# Patient Record
Sex: Male | Born: 1955 | Race: White | Hispanic: No | Marital: Married | Smoking: Current every day smoker
Health system: Southern US, Community
[De-identification: ages and names within clinical notes are randomized; demographics above are authoritative.]

## PROBLEM LIST (undated history)

## (undated) DIAGNOSIS — M109 Gout, unspecified: Secondary | ICD-10-CM

## (undated) DIAGNOSIS — E119 Type 2 diabetes mellitus without complications: Secondary | ICD-10-CM

## (undated) DIAGNOSIS — I739 Peripheral vascular disease, unspecified: Secondary | ICD-10-CM

## (undated) HISTORY — PX: BONE CYST EXCISION: SHX376

---

## 1998-07-27 ENCOUNTER — Emergency Department (HOSPITAL_COMMUNITY): Admission: EM | Admit: 1998-07-27 | Discharge: 1998-07-28 | Payer: Self-pay | Admitting: Emergency Medicine

## 2000-02-08 ENCOUNTER — Encounter: Payer: Self-pay | Admitting: Family Medicine

## 2000-02-08 ENCOUNTER — Encounter: Admission: RE | Admit: 2000-02-08 | Discharge: 2000-02-08 | Payer: Self-pay | Admitting: Family Medicine

## 2001-12-02 ENCOUNTER — Encounter: Payer: Self-pay | Admitting: Orthopedic Surgery

## 2001-12-02 ENCOUNTER — Emergency Department (HOSPITAL_COMMUNITY): Admission: EM | Admit: 2001-12-02 | Discharge: 2001-12-02 | Payer: Self-pay | Admitting: *Deleted

## 2002-02-05 ENCOUNTER — Encounter: Payer: Self-pay | Admitting: Orthopedic Surgery

## 2002-02-05 ENCOUNTER — Ambulatory Visit (HOSPITAL_COMMUNITY): Admission: RE | Admit: 2002-02-05 | Discharge: 2002-02-05 | Payer: Self-pay | Admitting: Orthopedic Surgery

## 2007-08-11 ENCOUNTER — Encounter: Admission: RE | Admit: 2007-08-11 | Discharge: 2007-08-11 | Payer: Self-pay | Admitting: Family Medicine

## 2014-10-11 ENCOUNTER — Inpatient Hospital Stay (HOSPITAL_COMMUNITY)
Admission: EM | Admit: 2014-10-11 | Discharge: 2014-10-12 | Discharge status: Against Medical Advice | Payer: No Typology Code available for payment source | Source: Home / Self Care | Attending: Vascular Surgery | Admitting: Vascular Surgery

## 2014-10-11 ENCOUNTER — Inpatient Hospital Stay (HOSPITAL_COMMUNITY): Payer: No Typology Code available for payment source

## 2014-10-11 ENCOUNTER — Encounter (HOSPITAL_COMMUNITY): Payer: Self-pay | Admitting: Emergency Medicine

## 2014-10-11 ENCOUNTER — Emergency Department (HOSPITAL_COMMUNITY)
Admission: EM | Admit: 2014-10-11 | Discharge: 2014-10-11 | DRG: 301 | Disposition: A | Discharge status: Home / Self Care | Payer: No Typology Code available for payment source | Attending: Vascular Surgery | Admitting: Vascular Surgery

## 2014-10-11 ENCOUNTER — Encounter (HOSPITAL_COMMUNITY): Payer: Self-pay | Admitting: Family Medicine

## 2014-10-11 ENCOUNTER — Emergency Department (HOSPITAL_COMMUNITY): Payer: No Typology Code available for payment source

## 2014-10-11 DIAGNOSIS — I739 Peripheral vascular disease, unspecified: Secondary | ICD-10-CM | POA: Diagnosis present

## 2014-10-11 DIAGNOSIS — M109 Gout, unspecified: Secondary | ICD-10-CM

## 2014-10-11 DIAGNOSIS — I96 Gangrene, not elsewhere classified: Secondary | ICD-10-CM

## 2014-10-11 DIAGNOSIS — R208 Other disturbances of skin sensation: Secondary | ICD-10-CM

## 2014-10-11 DIAGNOSIS — F17210 Nicotine dependence, cigarettes, uncomplicated: Secondary | ICD-10-CM | POA: Diagnosis present

## 2014-10-11 DIAGNOSIS — M79673 Pain in unspecified foot: Secondary | ICD-10-CM | POA: Diagnosis present

## 2014-10-11 DIAGNOSIS — I7090 Unspecified atherosclerosis: Secondary | ICD-10-CM

## 2014-10-11 DIAGNOSIS — E119 Type 2 diabetes mellitus without complications: Secondary | ICD-10-CM | POA: Diagnosis not present

## 2014-10-11 DIAGNOSIS — Z01810 Encounter for preprocedural cardiovascular examination: Secondary | ICD-10-CM

## 2014-10-11 DIAGNOSIS — E1152 Type 2 diabetes mellitus with diabetic peripheral angiopathy with gangrene: Secondary | ICD-10-CM

## 2014-10-11 DIAGNOSIS — I743 Embolism and thrombosis of arteries of the lower extremities: Secondary | ICD-10-CM

## 2014-10-11 DIAGNOSIS — I70262 Atherosclerosis of native arteries of extremities with gangrene, left leg: Secondary | ICD-10-CM

## 2014-10-11 DIAGNOSIS — M79609 Pain in unspecified limb: Secondary | ICD-10-CM

## 2014-10-11 HISTORY — DX: Gout, unspecified: M10.9

## 2014-10-11 LAB — CBC WITH DIFFERENTIAL/PLATELET
BASOS ABS: 0.1 10*3/uL (ref 0.0–0.1)
Basophils Relative: 1 % (ref 0–1)
Eosinophils Absolute: 0.2 10*3/uL (ref 0.0–0.7)
Eosinophils Relative: 1 % (ref 0–5)
HEMATOCRIT: 43 % (ref 39.0–52.0)
HEMOGLOBIN: 14.7 g/dL (ref 13.0–17.0)
LYMPHS PCT: 15 % (ref 12–46)
Lymphs Abs: 2.6 10*3/uL (ref 0.7–4.0)
MCH: 30.4 pg (ref 26.0–34.0)
MCHC: 34.2 g/dL (ref 30.0–36.0)
MCV: 88.8 fL (ref 78.0–100.0)
MONO ABS: 1.4 10*3/uL — AB (ref 0.1–1.0)
Monocytes Relative: 8 % (ref 3–12)
NEUTROS ABS: 13 10*3/uL — AB (ref 1.7–7.7)
Neutrophils Relative %: 75 % (ref 43–77)
Platelets: 374 10*3/uL (ref 150–400)
RBC: 4.84 MIL/uL (ref 4.22–5.81)
RDW: 13 % (ref 11.5–15.5)
WBC: 17.3 10*3/uL — ABNORMAL HIGH (ref 4.0–10.5)

## 2014-10-11 LAB — BASIC METABOLIC PANEL
ANION GAP: 13 (ref 5–15)
BUN: 5 mg/dL — ABNORMAL LOW (ref 6–23)
CO2: 27 meq/L (ref 19–32)
CREATININE: 0.6 mg/dL (ref 0.50–1.35)
Calcium: 9.8 mg/dL (ref 8.4–10.5)
Chloride: 99 mEq/L (ref 96–112)
GFR calc Af Amer: >90 mL/min (ref >90)
GFR calc non Af Amer: >90 mL/min (ref >90)
Glucose, Bld: 194 mg/dL — ABNORMAL HIGH (ref 70–99)
Potassium: 4.7 mEq/L (ref 3.7–5.3)
Sodium: 139 mEq/L (ref 137–147)

## 2014-10-11 LAB — HEMOGLOBIN A1C
Hgb A1c MFr Bld: 8.1 % — ABNORMAL HIGH (ref <5.7)
Mean Plasma Glucose: 186 mg/dL — ABNORMAL HIGH (ref <117)

## 2014-10-11 LAB — SEDIMENTATION RATE: SED RATE: 33 mm/h — AB (ref 0–16)

## 2014-10-11 LAB — C-REACTIVE PROTEIN: CRP: 8.8 mg/dL — ABNORMAL HIGH (ref <0.60)

## 2014-10-11 MED ORDER — PANTOPRAZOLE SODIUM 40 MG PO TBEC: 40.0000 mg | DELAYED_RELEASE_TABLET | Freq: Every day | ORAL | Status: DC

## 2014-10-11 MED ORDER — ACETAMINOPHEN 325 MG RE SUPP
325.0000 mg | RECTAL | Status: DC | PRN
  Filled 2014-10-11: qty 2

## 2014-10-11 MED ORDER — ONDANSETRON HCL 4 MG/2ML IJ SOLN
4.0000 mg | Freq: Three times a day (TID) | INTRAMUSCULAR | Status: DC | PRN
Start: 2014-10-11 — End: 2014-10-11

## 2014-10-11 MED ORDER — HYDRALAZINE HCL 20 MG/ML IJ SOLN: 5.0000 mg | INTRAMUSCULAR | Status: DC | PRN

## 2014-10-11 MED ORDER — MORPHINE SULFATE 2 MG/ML IJ SOLN: 2.0000 mg | INTRAMUSCULAR | Status: DC | PRN

## 2014-10-11 MED ORDER — IOHEXOL 350 MG/ML SOLN
100.0000 mL | Freq: Once | INTRAVENOUS | Status: AC | PRN
  Administered 2014-10-11: 100 mL via INTRAVENOUS

## 2014-10-11 MED ORDER — PIPERACILLIN-TAZOBACTAM 3.375 G IVPB
3.3750 g | Freq: Three times a day (TID) | INTRAVENOUS | Status: DC
  Administered 2014-10-11 – 2014-10-12 (×3): 3.375 g via INTRAVENOUS
  Filled 2014-10-11 (×5): qty 50

## 2014-10-11 MED ORDER — ONDANSETRON HCL 4 MG/2ML IJ SOLN: 4.0000 mg | Freq: Four times a day (QID) | INTRAMUSCULAR | Status: DC | PRN

## 2014-10-11 MED ORDER — INSULIN ASPART 100 UNIT/ML ~~LOC~~ SOLN: 0.0000 [IU] | SUBCUTANEOUS | Status: DC

## 2014-10-11 MED ORDER — IOHEXOL 350 MG/ML SOLN
50.0000 mL | Freq: Once | INTRAVENOUS | Status: AC | PRN
  Administered 2014-10-11: 50 mL via INTRAVENOUS

## 2014-10-11 MED ORDER — DOCUSATE SODIUM 100 MG PO CAPS
100.0000 mg | ORAL_CAPSULE | Freq: Two times a day (BID) | ORAL | Status: DC
  Filled 2014-10-11 (×3): qty 1

## 2014-10-11 MED ORDER — LABETALOL HCL 5 MG/ML IV SOLN
10.0000 mg | INTRAVENOUS | Status: DC | PRN
  Filled 2014-10-11: qty 4

## 2014-10-11 MED ORDER — POTASSIUM CHLORIDE CRYS ER 20 MEQ PO TBCR: 20.0000 meq | EXTENDED_RELEASE_TABLET | Freq: Once | ORAL | Status: DC

## 2014-10-11 MED ORDER — GUAIFENESIN-DM 100-10 MG/5ML PO SYRP: 15.0000 mL | ORAL_SOLUTION | ORAL | Status: DC | PRN

## 2014-10-11 MED ORDER — VANCOMYCIN HCL 10 G IV SOLR
2000.0000 mg | Freq: Once | INTRAVENOUS | Status: AC
  Administered 2014-10-11: 2000 mg via INTRAVENOUS
  Filled 2014-10-11: qty 2000

## 2014-10-11 MED ORDER — DEXTROSE-NACL 5-0.45 % IV SOLN: INTRAVENOUS | Status: DC

## 2014-10-11 MED ORDER — METOPROLOL TARTRATE 1 MG/ML IV SOLN: 2.0000 mg | INTRAVENOUS | Status: DC | PRN

## 2014-10-11 MED ORDER — HEPARIN (PORCINE) IN NACL 100-0.45 UNIT/ML-% IJ SOLN
1800.0000 [IU]/h | INTRAMUSCULAR | Status: DC
  Administered 2014-10-11: 1500 [IU]/h via INTRAVENOUS
  Filled 2014-10-11 (×2): qty 250

## 2014-10-11 MED ORDER — ACETAMINOPHEN 325 MG PO TABS: 325.0000 mg | ORAL_TABLET | ORAL | Status: DC | PRN

## 2014-10-11 MED ORDER — VANCOMYCIN HCL IN DEXTROSE 1-5 GM/200ML-% IV SOLN
1000.0000 mg | Freq: Three times a day (TID) | INTRAVENOUS | Status: DC
  Administered 2014-10-12 (×2): 1000 mg via INTRAVENOUS
  Filled 2014-10-11 (×3): qty 200

## 2014-10-11 MED ORDER — HEPARIN BOLUS VIA INFUSION
4000.0000 [IU] | Freq: Once | INTRAVENOUS | Status: AC
  Administered 2014-10-11: 4000 [IU] via INTRAVENOUS
  Filled 2014-10-11: qty 4000

## 2014-10-11 MED ORDER — OXYCODONE-ACETAMINOPHEN 5-325 MG PO TABS: 1.0000 | ORAL_TABLET | ORAL | Status: DC | PRN

## 2014-10-11 MED ORDER — PHENOL 1.4 % MT LIQD
1.0000 | OROMUCOSAL | Status: DC | PRN
  Filled 2014-10-11: qty 177

## 2014-10-11 MED ORDER — ALUM & MAG HYDROXIDE-SIMETH 200-200-20 MG/5ML PO SUSP: 15.0000 mL | ORAL | Status: DC | PRN

## 2014-10-11 NOTE — Progress Notes (Addendum)
VASCULAR LAB PRELIMINARY   VENOUS DUPLEX: Left = No evidence of DVT.  ARTERIAL DUPLEX: Left = In the distal left femoral artery, there is a high grade stenosis (400 cm/s) just before occlusion of the artery. Recanalized flow is noted in proximal popliteal artery, but is severely diminished. Severely dampened monophasic flow is noted in PTA and ATA.   ABI completed:   RIGHT    LEFT    PRESSURE WAVEFORM  PRESSURE WAVEFORM  BRACHIAL 146 Tri BRACHIAL 139 Tri  DP   DP    AT 106 Mono AT 40 Severe damp mono  PT 108 Mono PT 37 Severe damp mono  PER   PER    GREAT TOE  NA GREAT TOE  NA    RIGHT LEFT  ABI 0.74, Mild arterial disease 0.27, severe arterial disease     Farrel DemarkJill Eunice, RDMS, RVT  10/11/2014, 12:47 PM

## 2014-10-11 NOTE — Progress Notes (Signed)
CTA reviewed.  Chronic SFA occlusions bilaterally 2 vessel runoff.  Continue heparin and antibiotics for now.  Will most likely need bypass early next week.  Will have cardiology review tomorrow.  Can eat tonight.    NPO p midnight for possible stress test tomorrow  Fabienne Brunsharles Edye Hainline, MD [facility] [place] Office: [phone] Pager: [phone]

## 2014-10-11 NOTE — Progress Notes (Signed)
ANTICOAGULATION/ANTIBIOTIC CONSULT NOTE - Initial Consult  Pharmacy Consult for Heparin/Vancomycin  No Known Allergies  Patient Measurements: Height: 6' (182.9 cm) Weight: 201 lb 8 oz (91.4 kg) IBW/kg (Calculated) : 77.6  Vital Signs: Temp: 98 F (36.7 C) (11/13 1631) Temp Source: Oral (11/13 1631) BP: 138/87 mmHg (11/13 1631) Pulse Rate: 122 (11/13 1631)  Labs:  Recent Labs  10/11/14 1155  HGB 14.7  HCT 43.0  PLT 374  CREATININE 0.60    Estimated Creatinine Clearance: 113.2 mL/min (by C-G formula based on Cr of 0.6).   Medical History: Past Medical History  Diagnosis Date  . Gout     Medications:  Scheduled:  . docusate sodium  100 mg Oral BID  . pantoprazole  40 mg Oral Daily  . potassium chloride  20-40 mEq Oral Once    Assessment: 58 yo m who presented to the ED on 11/13 from [facility] for decreased sensation and vascular issues in left leg.  Pharmacy is consulted to begin heparin for PAD and vancomycin for cellulitis.  Wbc 17.3, tmax 98.4, SCr 0.60, CrCl > 100 ml/min, hgb 14.7, plts 374.   Goal of Therapy:  Heparin level 0.3-0.7 units/ml Monitor platelets by anticoagulation protocol: Yes  Vancomycin trough level 10-15 mcg/ml   Plan:  Heparin bolus 4,000 units x 1 Heparin infusion at 1500 units/hr 6-hr HL @ midnight Vancomycin 2 gms IV x 1 Followed by vancomycin 1,000 mg IV q8hrs Monitor CBC, renal function, s/s of bleeding, clinical course  Sanjuan Sawa L. Roseanne RenoStewart, PharmD Clinical Pharmacy Resident Pager: [phone] 10/11/2014 5:49 PM

## 2014-10-11 NOTE — ED Notes (Signed)
Attempted report 

## 2014-10-11 NOTE — ED Provider Notes (Signed)
CSN: [id number]     Arrival date & time 10/11/14  1856 History   First MD Initiated Contact with Patient 10/11/14 1011     Chief Complaint  Patient presents with  . Leg Pain  . left toe necrotic      (Consider location/radiation/quality/duration/timing/severity/associated sxs/prior Treatment) HPI  Jerome Gomez is a 58 y.o. male with PMH of gout presenting after pushing heavy speakers last month resulted in bilateral leg pain but left leg pain has continued and gotten worse. Pain to the touch. Patient notes that last night he noticed that his fourth toe became black bluish. He denies pain in the toe but still has persistent pain in his left calf. Patient able to move all his toes but with decreased sensation. Patient denies history of diabetes, peripheral vascular disease, cardiac history. Patient is chronic smoker. Vague remote history of claudication. No fevers, chills, nausea, vomiting or recent illnesses. Issue with history of gout and is only taken his medications without relief.    Past Medical History  Diagnosis Date  . Gout    Past Surgical History  Procedure Laterality Date  . Bone cyst excision     No family history on file. History  Substance Use Topics  . Smoking status: Current Every Day Smoker    Types: Cigarettes  . Smokeless tobacco: Not on file  . Alcohol Use: No    Review of Systems  Constitutional: Negative for fever and chills.  HENT: Negative for congestion and rhinorrhea.   Eyes: Negative for visual disturbance.  Respiratory: Negative for cough and shortness of breath.   Cardiovascular: Positive for leg swelling. Negative for chest pain and palpitations.  Gastrointestinal: Negative for nausea, vomiting and diarrhea.  Genitourinary: Negative for dysuria and hematuria.  Musculoskeletal: Negative for back pain and gait problem.  Skin: Negative for rash.  Neurological: Negative for weakness and headaches.      Allergies  Review of patient's  allergies indicates no known allergies.  Home Medications   Prior to Admission medications   Medication Sig Start Date End Date Taking? Authorizing Provider  Ascorbic Acid (VITAMIN C PO) Take 2 tablets by mouth once.   Yes Historical Provider, MD  OVER THE COUNTER MEDICATION Take 2 tablets by mouth once. Natural Antibiotic   Yes Historical Provider, MD  traMADol (ULTRAM) 50 MG tablet Take 100 mg by mouth every 6 (six) hours as needed for moderate pain.   Yes Historical Provider, MD   BP 145/83 mmHg  Pulse 98  Temp(Src) 98.4 F (36.9 C) (Oral)  Resp 15  SpO2 100% Physical Exam  Constitutional: He appears well-developed and well-nourished. No distress.  HENT:  Head: Normocephalic and atraumatic.  Eyes: Conjunctivae and EOM are normal. Right eye exhibits no discharge. Left eye exhibits no discharge.  Cardiovascular: Normal rate, regular rhythm and normal heart sounds.   Pulmonary/Chest: Effort normal and breath sounds normal. No respiratory distress. He has no wheezes.  Abdominal: Soft. Bowel sounds are normal. He exhibits no distension. There is no tenderness.  Neurological: He is alert. He exhibits normal muscle tone. Coordination normal.  Skin: Skin is warm and dry. He is not diaphoretic.  Patient with increased erythema and mild swelling to left foot including the toes and anterior foot. Patient also with non raised red streaks from medial ankle to left fourth toe. Left fourth toe necrotic with lateral ulcer with eschar. Patient with tenderness to palpation of left calf. No tenderness to toes or feet. Patient with decreased sensation to  distal toes with cap refill greater than 3 seconds. Pedal pulses found with Doppler and equal to right pulse. Patient with intact strength with flexion and extension.  Nursing note and vitals reviewed.   ED Course  Procedures (including critical care time) Labs Review Labs Reviewed  CBC WITH DIFFERENTIAL - Abnormal; Notable for the following:     WBC 17.3 (*)    Neutro Abs 13.0 (*)    Monocytes Absolute 1.4 (*)    All other components within normal limits  BASIC METABOLIC PANEL - Abnormal; Notable for the following:    Glucose, Bld 194 (*)    BUN 5 (*)    All other components within normal limits  SEDIMENTATION RATE - Abnormal; Notable for the following:    Sed Rate 33 (*)    All other components within normal limits  C-REACTIVE PROTEIN - Abnormal; Notable for the following:    CRP 8.8 (*)    All other components within normal limits  HEMOGLOBIN A1C - Abnormal; Notable for the following:    Hgb A1c MFr Bld 8.1 (*)    Mean Plasma Glucose 186 (*)    All other components within normal limits  WOUND CULTURE    Imaging Review Dg Chest 2 View  10/11/2014   CLINICAL DATA:  Preop cardiovascular exam, smoker, gangrene of digit  EXAM: CHEST  2 VIEW  COMPARISON:  None.  FINDINGS: Cardiomediastinal silhouette is unremarkable. No acute infiltrate or pleural effusion. No pulmonary edema. Bony thorax is unremarkable.  IMPRESSION: No active cardiopulmonary disease.   Electronically Signed   By: Lahoma Crocker M.D.   On: 10/11/2014 18:38   Dg Tibia/fibula Left  10/11/2014   CLINICAL DATA:  Lower extremity swelling with redness, no known trauma, initial encounter  EXAM: LEFT TIBIA AND FIBULA - 2 VIEW  COMPARISON:  None.  FINDINGS: No acute fracture or dislocation is noted. Small calcaneal spurs are seen. No gross soft tissue abnormality is noted.  IMPRESSION: No acute abnormality noted.   Electronically Signed   By: Inez Catalina M.D.   On: 10/11/2014 11:49   Ct Angio Ao+bifem W/cm &/or Wo/cm  10/11/2014   CLINICAL DATA:  Left foot ischemia with purple fourth toe, check for possible embolic source  EXAM: CT ANGIOGRAPHY OF ABDOMINAL AORTA WITH ILIOFEMORAL RUNOFF  TECHNIQUE: Multidetector CT imaging of the abdomen, pelvis and lower extremities was performed using the standard protocol during bolus administration of intravenous contrast. Multiplanar CT  image reconstructions and MIPs were obtained to evaluate the vascular anatomy.  CONTRAST:  78m OMNIPAQUE IOHEXOL 350 MG/ML SOLN, 1064mOMNIPAQUE IOHEXOL 350 MG/ML SOLN  COMPARISON:  None.  FINDINGS: Aorta: The abdominal aorta is well visualized and demonstrates some irregular soft atherosclerotic plaque throughout the infrarenal segment. Very mild ectasia is noted with maximum diameter of 3.0 x 2.9 cm. Given the irregular nature of the atherosclerotic plaque this is likely the etiology of the patient's embolic disease. The celiac axis, superior mesenteric artery and inferior mesenteric artery are all widely patent. Single renal arteries are identified bilaterally without focal stenosis.  Right Lower Extremity: The right common iliac artery demonstrates some calcified and noncalcified plaque. No focal stenosis is noted. The external iliac artery shows similar findings. There is stenosis of approximately 50% at the origin of the right superficial femoral artery. Beyond this focal stenosis superficial femoral artery is within normal limits. At the level of the adductor canal however there is a focal concentric high-grade stenosis identified with subsequent short segment occlusion.  Reconstitution of the proximal popliteal artery is noted via muscular collaterals. The popliteal artery is patent but demonstrates some narrowing distally. The popliteal trifurcation is patent. The anterior tibial artery is occluded just beyond its origin. The peroneal and posterior tibial arteries are patent to the level of the ankle.  Left Lower Extremity: The left common external iliac arteries show similar findings to that on the right. A focal 40-50% stenosis of the proximal external iliac artery is noted on the left. Common femoral bifurcation is patent. The superficial femoral artery demonstrates scattered atherosclerotic change throughout its course with focal short segment occlusion at the level of the adductor canal. Multiple  muscular collaterals reconstitute the popliteal artery after approximately 2-3 cm. It demonstrates scattered atherosclerotic change as well. The trifurcation of the popliteal artery on the left is widely patent with 2 vessel runoff to the level of the ankle. The peroneal artery occludes in the mid to distal calf. Anterior tibial artery is difficult to follow in the foot. Posterior tibial artery extends to at least the level of the metatarsal heads. More distal arterial visualization is difficult.  Review of the MIP images confirms the above findings.  Nonvascular: The liver is mildly fatty infiltrated. The gallbladder, spleen, adrenal glands and pancreas are all normal in their CT appearance. The kidneys demonstrate a normal enhancement pattern. A few small cysts are noted. No obstructive changes are seen. Small periumbilical fat containing hernia is noted. No bowel is noted within.  Scattered diverticular change is seen without diverticulitis. The appendix and bladder are within normal limits as visualized. No significant lymphadenopathy is noted. No acute bony abnormality is seen.  IMPRESSION: Irregular soft atherosclerotic plaque within the abdominal aorta likely the source of the distal emboli.  Bilateral superficial femoral/popliteal artery occlusion at the level of the adductor canal. Additionally there is a 50% stenosis of the proximal right superficial femoral artery at its origin.  Two vessel runoff bilaterally involving the peroneal and posterior tibial artery on the right and the anterior and posterior tibial arteries on the left.  Chronic nonvascular changes as described above.   Electronically Signed   By: Inez Catalina M.D.   On: 10/11/2014 19:12   Dg Foot Complete Left  10/11/2014   CLINICAL DATA:  Pain with soft tissue swelling. Evidence of bruising. No known trauma.  EXAM: LEFT FOOT - COMPLETE 3+ VIEW  COMPARISON:  None.  FINDINGS: Frontal, oblique, and lateral views were obtained. There is no  demonstrable fracture or dislocation. There is mild narrowing of the first MTP joint as well as all DIP joints. There is no erosive change or bony destruction. There is an os naviculare, an anatomic variant. There is a small posterior calcaneal spur.  IMPRESSION: Narrowing of several distal joints. No fracture or dislocation. No erosive change or bony destruction.   Electronically Signed   By: Lowella Grip M.D.   On: 10/11/2014 11:44   Ct Angio Chest Aortic Dissect W &/or W/o  10/11/2014   CLINICAL DATA:  History of left foot ischemia, check for embolic source.  EXAM: CT ANGIOGRAPHY CHEST WITH CONTRAST  TECHNIQUE: Multidetector CT imaging of the chest was performed using the standard protocol during bolus administration of intravenous contrast. Multiplanar CT image reconstructions and MIPs were obtained to evaluate the vascular anatomy.  CONTRAST:  72m OMNIPAQUE IOHEXOL 350 MG/ML SOLN, 1039mOMNIPAQUE IOHEXOL 350 MG/ML SOLN  COMPARISON:  None.  FINDINGS: The lungs are well aerated bilaterally without focal infiltrate or sizable effusion. No  parenchymal nodule is noted.  The thoracic inlet shows some mild heterogeneity to the thyroid without definitive nodule. The thoracic aorta show some mild calcific changes. Very mild soft atherosclerotic plaque is noted in the descending thoracic aorta. No dissection or aneurysmal dilatation is seen. The origins of the brachiocephalic vessels are within normal limits.  The cardiac structures show no acute abnormality. No large thrombus is noted within the left atrium. No hilar or mediastinal adenopathy is seen. No gross pulmonary embolism is seen. The osseous structures as visualized are within normal limits.  Review of the MIP images confirms the above findings.  IMPRESSION: Mild atherosclerotic changes within the thoracic aorta without aneurysmal dilatation or dissection. No definitive source for embolic material is identified.  No other focal abnormality is noted.    Electronically Signed   By: Inez Catalina M.D.   On: 10/11/2014 18:59     EKG Interpretation None      MDM   Final diagnoses:  Decreased sensation  Peripheral vascular disease  Arterial occlusion  Gangrene of toe   Pt without palpable pedal pulses, decreased sensation, decreased cap refill and with necrosis of left 4 toe due to occlusion of the distal femoral artery by [place]. Consult to Vascular surgery. Spoke with Dr. Oneida Alar who agrees to evaluation the patient and states it will be around 1:00pm. XRays noncontributory. ESR and CRP pending. Dr. Oneida Alar evaluated patient and recommend admission with plan for bypass surgery. Pt drove to ED and requests to drive his own car to [facility] for direct admission to vascular surgery. Pt agreeable to plan and discharge papers provided with the plan for patient to go directly to [facility].  Discussed return precautions with patient. Discussed all results and patient verbalizes understanding and agrees with plan.  This is a shared patient. This patient was discussed with the physician, Dr. Reather Converse who saw and evaluated the patient and agrees with the plan.        Pura Spice, PA-C 10/11/14 2057  Mariea Clonts, MD 10/15/14 [phone]

## 2014-10-11 NOTE — ED Notes (Signed)
Pt states that pushing and carrying heavy speakers about month. Pt had leg pain after but didn't think anything about it. Right leg pain stopped but left pain continued.  Pt states that last night he noticed the his 4 th toe on left foot is black-bluish.  Pt's left lower extremity appears more red and swollen compared to right LLE. Pt states that he thought could be gout so he took his meds for it but didn't help.

## 2014-10-11 NOTE — ED Notes (Signed)
Pt aware of plan of care, to be an admission to [facility]. Encouraged to go directly to [facility] via car. Pt states has things he has to take care of before arrival. Pt to go to admissions at [facility] upon arrival. If closed to go through Emergency Dept. Pt should be a direct admit. Orders for this pt and in EPIC

## 2014-10-11 NOTE — ED Notes (Signed)
Vascular at BS 

## 2014-10-11 NOTE — ED Notes (Signed)
Patient transported to X-ray 

## 2014-10-11 NOTE — ED Notes (Signed)
Per pt sts that he was sent here from [place]. sts decreased sensation and vascular problems in left leg. sts left fourth toe blue. sts no feeling.

## 2014-10-11 NOTE — ED Notes (Signed)
MD at bedside. EDP ZAVITZ AND EDPA CREECH PRESENT

## 2014-10-11 NOTE — Discharge Instructions (Signed)
Return to the emergency room with worsening of symptoms, new symptoms or with symptoms that are concerning. Go directly to  for direct admission.

## 2014-10-11 NOTE — Consult Note (Addendum)
[facility] [place] HISTORY AND PHYSICAL   History of Present Illness:  Patient is a 58 y.o. year old male who presents for evaluation of left foot ischemia.  Pt was moving some things around a few days ago and noticed a pulling sensation in his left calf.  He noticed yesterday his left fourth toe had turned purple.  He has some pain in the foot.  He has no prior episodes and does not describe claudication.  He is a 1.5 ppd smoker.  Denies drugs or alcohol.  Denies chest pain or shortness of breath.  No prior cardiac or arrhythmia history. He denies hypertension diabetes or elevated cholesterol.   Other medical problems include gout which is stable.    Past Medical History  Diagnosis Date  . Gout     Past Surgical History  Procedure Laterality Date  . Bone cyst excision      Social History History  Substance Use Topics  . Smoking status: Current Every Day Smoker    Types: Cigarettes  . Smokeless tobacco: Not on file  . Alcohol Use: No    Family History No family history on file. Denies history of hypercoagulable state Has a brother that had a leg bypass  Allergies  No Known Allergies   Current Facility-Administered Medications  Medication Dose Route Frequency Provider Last Rate Last Dose  . ondansetron (ZOFRAN) injection 4 mg  4 mg Intravenous Q8H PRN Enid SkeensJoshua M Zavitz, MD       Current Outpatient Prescriptions  Medication Sig Dispense Refill  . Ascorbic Acid (VITAMIN C PO) Take 2 tablets by mouth once.    Marland Kitchen. OVER THE COUNTER MEDICATION Take 2 tablets by mouth once. Natural Antibiotic    . traMADol (ULTRAM) 50 MG tablet Take 100 mg by mouth every 6 (six) hours as needed for moderate pain.      ROS:   General:  No weight loss, Fever, chills  HEENT: No recent headaches, no nasal bleeding, no visual changes, no sore throat  Neurologic: No dizziness, blackouts, seizures. No recent symptoms of stroke or mini- stroke. No recent episodes of slurred  speech, or temporary blindness.  Cardiac: No recent episodes of chest pain/pressure, no shortness of breath at rest.  No shortness of breath with exertion.  Denies history of atrial fibrillation or irregular heartbeat  Vascular: No history of rest pain in feet.  No history of claudication.  No history of non-healing ulcer, No history of DVT   Pulmonary: No home oxygen, no productive cough, no hemoptysis,  No asthma or wheezing  Musculoskeletal:  [ ]  Arthritis, [ ]  Low back pain,  [ ]  Joint pain  Hematologic:No history of hypercoagulable state.  No history of easy bleeding.  No history of anemia  Gastrointestinal: No hematochezia or melena,  No gastroesophageal reflux, no trouble swallowing  Urinary: [ ]  chronic Kidney disease, [ ]  on HD - [ ]  MWF or [ ]  TTHS, [ ]  Burning with urination, [ ]  Frequent urination, [ ]  Difficulty urinating;   Skin: No rashes  Psychological: No history of anxiety,  No history of depression   Physical Examination  Filed Vitals:   10/11/14 0938 10/11/14 1309  BP: 144/87 143/82  Pulse: 104 90  Temp: 98 F (36.7 C) 98.2 F (36.8 C)  TempSrc: Oral Oral  Resp: 16 13  SpO2: 97% 97%    There is no height or weight on file to calculate BMI.  General:  Alert and oriented, no acute  distress HEENT: Normal Neck: No bruit or JVD Pulmonary: Clear to auscultation bilaterally Cardiac: Regular Rate and Rhythm without murmur Abdomen: Soft, non-tender, non-distended, no mass, no scars, umbilical hernia Skin: No rash, left fourth toe dusky to proximal phalanx, erythema to mid calf with red streak dorsum of foot Extremity Pulses:  2+ radial, brachial, femoral, absent dorsalis pedis, posterior tibial pulses bilaterally Musculoskeletal: No deformity or edema  Neurologic: Upper and lower extremity motor 5/5 and symmetric  DATA: ABI right 0.74, left 0.27   ASSESSMENT:  Severe PAD left leg and probably multilevel SFA tibial disease +/- infection, probable right SFA  occlusion most likely this is acute worsening of chronic disease rather than acute embolic event.   PLAN:  1.  Heparin drip now               2.  Pt needs transfer [facility]               3. CTA chest abdomen pelvis with runoff when pt [facility]              4. ASA              5. Lipid profile              6.  12 lead EKG             7.  Baseline labs BMET CBC             8.  Keep NPO for now   Discussed with pt that this is a limb threatening situation  Fabienne Brunsharles Fields, MD [facility] [place] Office: [phone] Pager: [phone]  Addendum: review of initial labwork glucose 200.  Insulin sliding scale and check A1C.  Will place on sliding scale probably new diagnosis diabetes.  Also WBC 17 k will place on Vanc Zosyn  Fabienne Brunsharles Fields, MD [facility] [place] Office: [phone] Pager: [phone]

## 2014-10-12 ENCOUNTER — Other Ambulatory Visit (HOSPITAL_COMMUNITY): Payer: Self-pay

## 2014-10-12 ENCOUNTER — Encounter (HOSPITAL_COMMUNITY): Payer: Self-pay | Admitting: Rehabilitation

## 2014-10-12 LAB — CBC
HEMATOCRIT: 38.1 % — AB (ref 39.0–52.0)
Hemoglobin: 12.7 g/dL — ABNORMAL LOW (ref 13.0–17.0)
MCH: 30 pg (ref 26.0–34.0)
MCHC: 33.3 g/dL (ref 30.0–36.0)
MCV: 89.9 fL (ref 78.0–100.0)
PLATELETS: 345 10*3/uL (ref 150–400)
RBC: 4.24 MIL/uL (ref 4.22–5.81)
RDW: 13.3 % (ref 11.5–15.5)
WBC: 13.3 10*3/uL — AB (ref 4.0–10.5)

## 2014-10-12 LAB — URINALYSIS, ROUTINE W REFLEX MICROSCOPIC
BILIRUBIN URINE: NEGATIVE
GLUCOSE, UA: NEGATIVE mg/dL
Hgb urine dipstick: NEGATIVE
KETONES UR: 15 mg/dL — AB
LEUKOCYTES UA: NEGATIVE
Nitrite: NEGATIVE
PH: 5.5 (ref 5.0–8.0)
Protein, ur: NEGATIVE mg/dL
Specific Gravity, Urine: 1.022 (ref 1.005–1.030)
Urobilinogen, UA: 1 mg/dL (ref 0.0–1.0)

## 2014-10-12 LAB — HEPARIN LEVEL (UNFRACTIONATED)
Heparin Unfractionated: <0.1 IU/mL — ABNORMAL LOW (ref 0.30–0.70)
Heparin Unfractionated: 0.3 IU/mL (ref 0.30–0.70)

## 2014-10-12 MED ORDER — HEPARIN BOLUS VIA INFUSION
2500.0000 [IU] | Freq: Once | INTRAVENOUS | Status: AC
  Administered 2014-10-12: 2500 [IU] via INTRAVENOUS
  Filled 2014-10-12: qty 2500

## 2014-10-12 NOTE — Progress Notes (Signed)
Pt informed of risks of limb loss.  He has refused stress test today.  He has newly diagnosed diabetes.  He states he cannot stay in hospital for further workup.  Risk of limb loss and untreated diabetes explained to pt.  He wishes to sign out against medical advice.  Fabienne Brunsharles Fields, MD [facility] [place] Office: [phone] Pager: [phone]

## 2014-10-12 NOTE — Progress Notes (Signed)
Transporter came to pick up pt for stress test and pt refused.  Pt decided he did not want stress test or procedure.  Pt feels he needs a second opinion and he needs to take care of work obligations.  Stated his wife will contact Dr. Verl DickerGanji's office as his daughter used to work there and get another opinion. Explained that pt would have to sign out against medical advice. I paged Dr. Darrick PennaFields and let him know. Pt aware of life threatening potential and new diabetes and wanted to leave. IVF discontinued and peripheral IV's removed. Telemetry removed and CCMD notified. AMA form in chart. Thomas HoffBurton, Ivyonna Hoelzel McClintock, RN

## 2014-10-12 NOTE — Progress Notes (Signed)
ANTICOAGULATION/ANTIBIOTIC CONSULT NOTE  Pharmacy Consult for Heparin No Known Allergies  Patient Measurements: Height: 6' (182.9 cm) Weight: 201 lb 8 oz (91.4 kg) IBW/kg (Calculated) : 77.6  Vital Signs: Temp: 98.2 F (36.8 C) (11/13 2158) Temp Source: Oral (11/13 2158) BP: 121/70 mmHg (11/13 2158) Pulse Rate: 93 (11/13 2158)  Labs:  Recent Labs  10/11/14 1155 10/12/14 0110  HGB 14.7  --   HCT 43.0  --   PLT 374  --   HEPARINUNFRC  --  <0.10*  CREATININE 0.60  --     Estimated Creatinine Clearance: 113.2 mL/min (by C-G formula based on Cr of 0.6).   Assessment: 58 yo male with left leg ischemia for heparin  Goal of Therapy:  Heparin level 0.3-0.7 units/ml Monitor platelets by anticoagulation protocol: Yes    Plan:  Heparin 2500 units IV bolus, then increase heparin 1800 units/hr Check heparin level in 6 hours.  Geannie RisenGreg Leonetta Mcgivern, PharmD, BCPS   10/12/2014 3:12 AM

## 2014-10-12 NOTE — Progress Notes (Addendum)
ANTICOAGULATION/ANTIBIOTIC CONSULT NOTE  Pharmacy Consult for Heparin No Known Allergies  Patient Measurements: Height: 6' (182.9 cm) Weight: 201 lb 8 oz (91.4 kg) IBW/kg (Calculated) : 77.6  Vital Signs: Temp: 98 F (36.7 C) (11/14 0429) Temp Source: Oral (11/14 0429) BP: 114/69 mmHg (11/14 0429) Pulse Rate: 87 (11/14 0429)  Labs:  Recent Labs  10/11/14 1155 10/12/14 0110 10/12/14 0526  HGB 14.7  --  12.7*  HCT 43.0  --  38.1*  PLT 374  --  345  HEPARINUNFRC  --  <0.10* 0.30  CREATININE 0.60  --   --     Estimated Creatinine Clearance: 113.2 mL/min (by C-G formula based on Cr of 0.6).   Assessment: 58 yo male admitted 10/11/2014  with left leg ischemia. Pharmacy consulted to dose heparin.  PMH: DM, gout, smoker  Coag: SFA occulusion, possible bypass early next week.  Heparin at goal, pending confirmation, CBC trend down (? Dilutional), no bleeding noted  ID:  Empiric cellulitis, due to elevated WBC on admission, trend down, afeb 11/13 Vanc>> 11/13 Zosyn>>  Endo: DM, HA1C - 8.1, Glucose 200  Goal of Therapy:  Heparin level 0.3-0.7 units/ml Monitor platelets by anticoagulation protocol: Yes    Plan:  Continue  Heparin @ 1800 units/hr Follow up confirmation, then daily heparin level and CBC. Continue Vancomycin 1g IV q8h  Thank you for allowing pharmacy to be a part of this patients care team.  Jerome Gomez Pharm.D., BCPS, AQ-Cardiology Clinical Pharmacist 10/12/2014 11:09 AM Pager: [phone] Phone: [phone]   12:11 PM patient leaving AMA, lab unable to draw heparin level.  Will cancel for now.  Follow up if patient remains in hospital.  Jerome Gomez

## 2014-10-12 NOTE — ED Provider Notes (Signed)
[facility] to be admitted by Dr. Darrick PennaFields due to ischemic/cellulitis leg.  Awaiting bed assignment.  Rolan BuccoMelanie Bohdan Macho, MD 10/12/14 1420

## 2014-10-12 NOTE — Progress Notes (Signed)
Called Dr. Darrick PennaFields to answer pt questions. Went over plan of care at shift change but pt concerned about when he would be able to leave.  Stated he is not ready to be out of work for two weeks. Dr. Darrick PennaFields has spoken to the pt several times. Pt aware he is newly diabetic and needs surgery.

## 2014-10-12 NOTE — Progress Notes (Signed)
[facility]  Subjective  - foot still hurts   Objective 114/69 87 98 F (36.7 C) (Oral) 18 99%  Intake/Output Summary (Last 24 hours) at 10/12/14 0914 Last data filed at 10/12/14 0700  Gross per 24 hour  Intake      0 ml  Output      0 ml  Net      0 ml   Left foot still with red streak dorsal foot, left fourth toe fairly well demarcated at proximal phalanx  Assessment/Planning: Cardiac stress test today Lexiscan approved by Dr Mayford Knifeurner CTA shows bilat SFA occlusion possible bypass early next week Keep heparin and antibiotics going for now Foot stable  Leukocytosis improved A1C 8 will consult diabetes care team   Jerome Gomez E 10/12/2014 9:14 AM --  Laboratory Lab Results:  Recent Labs  10/11/14 1155 10/12/14 0526  WBC 17.3* 13.3*  HGB 14.7 12.7*  HCT 43.0 38.1*  PLT 374 345   BMET  Recent Labs  10/11/14 1155  NA 139  K 4.7  CL 99  CO2 27  GLUCOSE 194*  BUN 5*  CREATININE 0.60  CALCIUM 9.8    COAG No results found for: INR, PROTIME No results found for: PTT

## 2014-10-12 NOTE — Plan of Care (Signed)
Problem: Consults Goal: Tobacco Cessation referral if indicated Outcome: Completed/Met Date Met:  10/12/14 Goal: Diabetes Guidelines if Diabetic/Glucose > 140 If diabetic or lab glucose is > 140 mg/dl - Initiate Diabetes/Hyperglycemia Guidelines & Document Interventions  Outcome: Completed/Met Date Met:  10/12/14  Comments:  Pt explained importance to quit smoking. Says he is not ready at this time.  Explained newly diagnosed diabetes and he said he would try to change his diet. 

## 2014-10-13 LAB — WOUND CULTURE

## 2014-10-14 ENCOUNTER — Telehealth (HOSPITAL_COMMUNITY): Payer: Self-pay

## 2014-10-14 ENCOUNTER — Inpatient Hospital Stay (HOSPITAL_COMMUNITY)
Admission: AD | Admit: 2014-10-14 | Discharge: 2014-10-16 | DRG: 253 | Disposition: A | Discharge status: Home / Self Care | Payer: No Typology Code available for payment source | Source: Ambulatory Visit | Attending: Cardiology | Admitting: Cardiology

## 2014-10-14 ENCOUNTER — Encounter (HOSPITAL_COMMUNITY): Payer: Self-pay | Admitting: Cardiology

## 2014-10-14 DIAGNOSIS — I7092 Chronic total occlusion of artery of the extremities: Secondary | ICD-10-CM | POA: Diagnosis present

## 2014-10-14 DIAGNOSIS — L97429 Non-pressure chronic ulcer of left heel and midfoot with unspecified severity: Secondary | ICD-10-CM | POA: Diagnosis present

## 2014-10-14 DIAGNOSIS — I70202 Unspecified atherosclerosis of native arteries of extremities, left leg: Secondary | ICD-10-CM | POA: Diagnosis present

## 2014-10-14 DIAGNOSIS — E1165 Type 2 diabetes mellitus with hyperglycemia: Secondary | ICD-10-CM | POA: Diagnosis present

## 2014-10-14 DIAGNOSIS — L309 Dermatitis, unspecified: Secondary | ICD-10-CM | POA: Diagnosis present

## 2014-10-14 DIAGNOSIS — F17210 Nicotine dependence, cigarettes, uncomplicated: Secondary | ICD-10-CM | POA: Diagnosis present

## 2014-10-14 DIAGNOSIS — E785 Hyperlipidemia, unspecified: Secondary | ICD-10-CM | POA: Diagnosis present

## 2014-10-14 DIAGNOSIS — M109 Gout, unspecified: Secondary | ICD-10-CM | POA: Diagnosis present

## 2014-10-14 DIAGNOSIS — I96 Gangrene, not elsewhere classified: Secondary | ICD-10-CM | POA: Diagnosis present

## 2014-10-14 DIAGNOSIS — I998 Other disorder of circulatory system: Secondary | ICD-10-CM | POA: Diagnosis present

## 2014-10-14 DIAGNOSIS — I739 Peripheral vascular disease, unspecified: Secondary | ICD-10-CM

## 2014-10-14 DIAGNOSIS — E119 Type 2 diabetes mellitus without complications: Secondary | ICD-10-CM

## 2014-10-14 HISTORY — DX: Type 2 diabetes mellitus without complications: E11.9

## 2014-10-14 LAB — PROTIME-INR
INR: 1.17 (ref 0.00–1.49)
PROTHROMBIN TIME: 15.1 s (ref 11.6–15.2)

## 2014-10-14 LAB — LIPID PANEL
CHOLESTEROL: 206 mg/dL — AB (ref 0–200)
HDL: 35 mg/dL — ABNORMAL LOW (ref >39)
LDL Cholesterol: 143 mg/dL — ABNORMAL HIGH (ref 0–99)
Total CHOL/HDL Ratio: 5.9 RATIO
Triglycerides: 141 mg/dL (ref <150)
VLDL: 28 mg/dL (ref 0–40)

## 2014-10-14 LAB — HCG, SERUM, QUALITATIVE: Preg, Serum: NEGATIVE

## 2014-10-14 LAB — GLUCOSE, CAPILLARY
GLUCOSE-CAPILLARY: 188 mg/dL — AB (ref 70–99)
Glucose-Capillary: 207 mg/dL — ABNORMAL HIGH (ref 70–99)

## 2014-10-14 MED ORDER — INSULIN ASPART 100 UNIT/ML ~~LOC~~ SOLN
0.0000 [IU] | Freq: Three times a day (TID) | SUBCUTANEOUS | Status: DC
  Administered 2014-10-14 – 2014-10-15 (×2): 2 [IU] via SUBCUTANEOUS
  Administered 2014-10-15 – 2014-10-16 (×4): 1 [IU] via SUBCUTANEOUS

## 2014-10-14 MED ORDER — SODIUM CHLORIDE 0.9 % IV SOLN: INTRAVENOUS | Status: DC

## 2014-10-14 MED ORDER — ASPIRIN EC 81 MG PO TBEC
81.0000 mg | DELAYED_RELEASE_TABLET | Freq: Every day | ORAL | Status: DC
Start: 2014-10-14 — End: 2014-10-16
  Administered 2014-10-14 – 2014-10-16 (×3): 81 mg via ORAL
  Filled 2014-10-14 (×2): qty 1

## 2014-10-14 MED ORDER — HEPARIN (PORCINE) IN NACL 100-0.45 UNIT/ML-% IJ SOLN
INTRAMUSCULAR | Status: AC
  Filled 2014-10-14: qty 250

## 2014-10-14 MED ORDER — CLOPIDOGREL BISULFATE 75 MG PO TABS
600.0000 mg | ORAL_TABLET | Freq: Once | ORAL | Status: AC
  Administered 2014-10-14: 600 mg via ORAL
  Filled 2014-10-14: qty 8

## 2014-10-14 MED ORDER — HYDROMORPHONE HCL 1 MG/ML IJ SOLN
1.0000 mg | INTRAMUSCULAR | Status: DC | PRN
  Administered 2014-10-14 – 2014-10-16 (×4): 1 mg via INTRAVENOUS
  Filled 2014-10-14 (×5): qty 1

## 2014-10-14 MED ORDER — HEPARIN (PORCINE) IN NACL 100-0.45 UNIT/ML-% IJ SOLN
2100.0000 [IU]/h | INTRAMUSCULAR | Status: DC
  Administered 2014-10-14: 1800 [IU]/h via INTRAVENOUS
  Administered 2014-10-15: 2100 [IU]/h via INTRAVENOUS
  Filled 2014-10-14 (×4): qty 250

## 2014-10-14 MED ORDER — INSULIN ASPART 100 UNIT/ML ~~LOC~~ SOLN
3.0000 [IU] | Freq: Three times a day (TID) | SUBCUTANEOUS | Status: DC
  Administered 2014-10-14 – 2014-10-16 (×3): 3 [IU] via SUBCUTANEOUS

## 2014-10-14 MED ORDER — DIAZEPAM 5 MG PO TABS
10.0000 mg | ORAL_TABLET | Freq: Every evening | ORAL | Status: DC | PRN
  Administered 2014-10-14: 10 mg via ORAL
  Filled 2014-10-14: qty 2

## 2014-10-14 MED ORDER — INSULIN ASPART 100 UNIT/ML ~~LOC~~ SOLN
0.0000 [IU] | Freq: Every day | SUBCUTANEOUS | Status: DC
  Administered 2014-10-14 – 2014-10-15 (×2): 2 [IU] via SUBCUTANEOUS

## 2014-10-14 MED ORDER — CLOPIDOGREL BISULFATE 75 MG PO TABS
75.0000 mg | ORAL_TABLET | Freq: Every day | ORAL | Status: DC
  Administered 2014-10-15 – 2014-10-16 (×2): 75 mg via ORAL
  Filled 2014-10-14: qty 1

## 2014-10-14 MED ORDER — SODIUM CHLORIDE 0.9 % IJ SOLN
3.0000 mL | Freq: Two times a day (BID) | INTRAMUSCULAR | Status: DC
  Administered 2014-10-15 – 2014-10-16 (×2): 3 mL via INTRAVENOUS

## 2014-10-14 MED ORDER — ATORVASTATIN CALCIUM 80 MG PO TABS
80.0000 mg | ORAL_TABLET | Freq: Every day | ORAL | Status: DC
  Administered 2014-10-14 – 2014-10-15 (×2): 80 mg via ORAL
  Filled 2014-10-14 (×3): qty 1

## 2014-10-14 NOTE — H&P (Signed)
Jerome Gomez is an 58 y.o. male.   Chief Complaint: Leg pain and ulcer HPI: The patient is a 58 year old male who presents with peripheral vascular disease. Patient is a 58 year old Caucasian male with history of tobacco use disorder, has about a 50-60-pack-year history of smoking, who was recently [facility] to [facility]  on 10/11/2014 with limb threatening ischemia, tenderness left fourth toe, was evaluated by vascular surgery and recommended lower extremity bypass surgery. Patient walked out AMA as he did not want surgery and wanted endovascular evaluation prior to surgery. He is now being seen in her office at his request on an urgent basis.  He has not seen a physician in years. Does not take any medications.  On further questioning, patient has had bilateral claudication symptoms in his calves ongoing for the past one year. However about a week ago he started having rest and and eventually noticed his feet was getting much more red and also painful. No fever, no other associated symptoms. He denies any chest pain or shortness of breath. Smokes about 1.5 pack of cigarettes a day. While in the hospital his hemoglobin A1c was also elevated at 8.1. Presently not on any medications, he is presently taking 81 mg aspirin and atorvastatin 80 mg 1 from a family member.  Past Medical History  Diagnosis Date  . Gout   . New onset type 2 diabetes mellitus 10/14/2014    Past Surgical History  Procedure Laterality Date  . Bone cyst excision      History reviewed. No pertinent family history. Social History:  reports that he has been smoking Cigarettes.  He has a 45 pack-year smoking history. He has never used smokeless tobacco. He reports that he does not drink alcohol or use illicit drugs.  Allergies: No Known Allergies  Medications Prior to Admission  Medication Sig Dispense Refill  . Ascorbic Acid (VITAMIN C PO) Take 2 tablets by mouth once.    Marland Kitchen. OVER THE COUNTER  MEDICATION Take 2 tablets by mouth once. Natural Antibiotic    . traMADol (ULTRAM) 50 MG tablet Take 100 mg by mouth every 6 (six) hours as needed for moderate pain.        Review of Systems - General:Present- Feeling well. Not Present- Fatigue, Fever and Night Sweats. Skin:Not Present- Itching and Rash. HEENT:Not Present- Headache. Respiratory:Not Present- Difficulty Breathing. Cardiovascular:Present- Claudications and Leg Pain and/or Swelling (left leg). Not Present- Fainting, Orthopnea and Swelling of Extremities. Gastrointestinal:Not Present- Abdominal Pain, Constipation, Diarrhea, Nausea and Vomiting. Musculoskeletal:Not Present- Joint Swelling. Neurological:Not Present- Headaches. Hematology:Not Present- Blood Clots, Easy Bruising and Nose Bleed.  Blood pressure 128/72, pulse 105, temperature 98.7 F (37.1 C), temperature source Oral, resp. rate 19, height 6' (1.829 m), weight 90.493 kg (199 lb 8 oz), SpO2 98 %. General appearance: alert, appears stated age and no distress Eyes: negative findings: conjunctivae and sclerae normal Neck: no carotid bruit, no JVD, supple, symmetrical, trachea midline and thyroid not enlarged, symmetric, no tenderness/mass/nodules Neck: JVP - normal, carotids 2+= without bruits Resp: clear to auscultation bilaterally Chest wall: no tenderness Cardio: regular rate and rhythm, S1, S2 normal, no murmur, click, rub or gallop GI: soft, non-tender; bowel sounds normal; no masses,  no organomegaly Extremities: Please see vascular exam. No edema.  Pulses: Right Pulses: FEM: present 2+, POP: absent, DP: absent, PT: absent Left Pulses: FEM: present 2+, POP: absent, DP: absent, PT: absent Lower Extremity:Inspection- Left- Cyanotic nailbeds and Digital infarcts (left foot 4 toe dry gangrene. Inflamed  and red and purplish change. Foot is warm). No Pigmentation or Varicose veins Skin: Otherwise normal except see vascular exam Neurologic: Grossly  normal  Results for orders placed or performed during the hospital encounter of 10/14/14 (from the past 48 hour(s))  Lipid panel     Status: Abnormal   Collection Time: 10/14/14  3:40 PM  Result Value Ref Range   Cholesterol 206 (H) 0 - 200 mg/dL   Triglycerides 086141 <578<150 mg/dL   HDL 35 (L) >46>39 mg/dL   Total CHOL/HDL Ratio 5.9 RATIO   VLDL 28 0 - 40 mg/dL   LDL Cholesterol 962143 (H) 0 - 99 mg/dL    Comment:        Total Cholesterol/HDL:CHD Risk Coronary Heart Disease Risk Table                     Men   Women  1/2 Average Risk   3.4   3.3  Average Risk       5.0   4.4  2 X Average Risk   9.6   7.1  3 X Average Risk  23.4   11.0        Use the calculated Patient Ratio above and the CHD Risk Table to determine the patient's CHD Risk.        ATP III CLASSIFICATION (LDL):  <100     mg/dL   Optimal  [id number]  mg/dL   Near or Above                    Optimal  130-159  mg/dL   Borderline  [id number]  mg/dL   High  >027>190     mg/dL   Very High   Protime-INR     Status: None   Collection Time: 10/14/14  3:40 PM  Result Value Ref Range   Prothrombin Time 15.1 11.6 - 15.2 seconds   INR 1.17 0.00 - 1.49  hCG, serum, qualitative     Status: None   Collection Time: 10/14/14  3:40 PM  Result Value Ref Range   Preg, Serum NEGATIVE NEGATIVE    Comment:        THE SENSITIVITY OF THIS METHODOLOGY IS >10 mIU/mL.   Glucose, capillary     Status: Abnormal   Collection Time: 10/14/14  4:37 PM  Result Value Ref Range   Glucose-Capillary 188 (H) 70 - 99 mg/dL   No results found.  Labs:   Lab Results  Component Value Date   WBC 13.3* 10/12/2014   HGB 12.7* 10/12/2014   HCT 38.1* 10/12/2014   MCV 89.9 10/12/2014   PLT 345 10/12/2014    Recent Labs Lab 10/11/14 1155  NA 139  K 4.7  CL 99  CO2 27  BUN 5*  CREATININE 0.60  CALCIUM 9.8  GLUCOSE 194*   No results found for: CKTOTAL, CKMB, CKMBINDEX, TROPONINI  Lipid Panel     Component Value Date/Time   CHOL 206* 10/14/2014 1540    TRIG 141 10/14/2014 1540   HDL 35* 10/14/2014 1540   CHOLHDL 5.9 10/14/2014 1540   VLDL 28 10/14/2014 1540   LDLCALC 143* 10/14/2014 1540   CT angio abdomen with bifem R/O 10/11/2014: Chest CT report also reviewed.  Impression: Irregular soft atherosclerotic plaque within the abdominal aorta likely the source of the distal emboli.  Bilateral superficial femoral/popliteal artery occlusion at the level of the adductor canal. Additionally there is a 50% stenosis of the proximal right superficial femoral  artery at its origin.   Two vessel runoff bilaterally involving the peroneal and posterior tibial artery on the right and the anterior and posterior tibial arteries on the left.  EKG: normal EKG, normal sinus rhythm.  Outpatient Lexiscan sestamibi stress 10/14/2014: 1. The resting electrocardiogram demonstrated normal sinus rhythm, normal resting conduction, no resting arrhythmias and normal rest repolarization. The stress electrocardiogram was non-diagnostic due to pharmacologic stress. 2. SPECT images demonstrate small perfusion abnormality of severe intensity in the basal inferior and mid inferior myocardial wall(s) on the stress images. The defect is not present on the resting images consistent with ischemia. The left ventricular ejection fraction was calculated or visually estimated to be 43%. Overall an intermediate risk study, clinical correlation recommended.   Assessment/Plan 1. Peripheral vascular disease with pain at rest  ABI 10/11/2014: Right ABI 0.73. Monophasic waveforms. Left ABI severely dampened monophasic a form, severely reduced diffusion to the left leg, ABI 0.27. 2. Diabetes mellitus type 2, uncontrolled  HbA1c 10/11/2014: 8.1%. CBC 10/11/2014: HB 4.7/HCT 38.1. White count elevated at 13.3. Normal platelet count at 345. BMP revealed elevated blood sugar at 194 mg, BUN 5, serum creatinine 0.60. 3. Tobacco use disorder, continuous   Plan:  I discussed  primary/secondary prevention and also dietary counceling was done. Patient presents on a urgent basis for evaluation of peripheral arterial disease. Patient has been threatening ischemia, fortunately has dry gangrene involving his left fourth toe only and given a diabetic state, I'm very concerned about onset of infection and spread of ischemic complications. He needs to be admitted to the hospital for heparin and pain control. Symptoms ongoing for the past one month and recent 1 week or so of discoloration suggests chronic ischemia. Reviewed Radiology CTA and the lesions in the legs appear to be amenable for PTA.  Patient needs to stratification, I'll set him up for a Lexiscan sestamibi stress test today and admit him to the hospital and start the patient on IV heparin and also management of diabetes mellitus, check lipids, Add ASA/Plavix. I'll try to set him up for peripheral arteriogram tomorrow morning to evaluate his peripheral anatomy. I had a long discussion with the patient and explained to him regarding risks associated with peripheral arteriogram and possible angioplasty including but not limited to less than 1% risk of death, limb loss, need for urgent surgical device position. Eventually she'll also need amputation office left fourth toe. All questions answered. We have discussed regarding smoking cessation. Although he has a moderate risk stress test, I do not see LAD territory of multivessel distribution ischemia, hence given normal EKG, no episode of angina pectoris, no episodes of CHF, we will proceed with referral arteriogram and possible angioplasty.  Pamella Pert, MD 10/14/2014, 6:23 PM [facility] Pager: [phone] Office: [phone] If no answer: Cell:  [phone]

## 2014-10-14 NOTE — Progress Notes (Signed)
ANTICOAGULATION CONSULT NOTE - Initial Consult  Pharmacy Consult for heparin Indication: limb ischemia  No Known Allergies  Patient Measurements:  IBW: 77.6 kg Heparin Dosing Weight: 91.4 kg  Vital Signs:    Labs:  Recent Labs  10/12/14 0110 10/12/14 0526  HGB  --  12.7*  HCT  --  38.1*  PLT  --  345  HEPARINUNFRC <0.10* 0.30    Estimated Creatinine Clearance: 113.2 mL/min (by C-G formula based on Cr of 0.6).   Medical History: Past Medical History  Diagnosis Date  . Gout   . New onset type 2 diabetes mellitus 10/14/2014    Medications:  No prescriptions prior to admission    Assessment: Pt admitted this past Friday, now re-admitted with cellulitis and continues to have limb ischemia.  Pt has bilateral chronic SFA occlusions and refused work-up during pervious admission. He was previously on the low end of therapeutic range on 1800 units/hr, therefore will start there and evaluate tonight.  His CBC is stable and wnl. He is not on any anticoagulants prior to admission.  Goal of Therapy:  Heparin level 0.3-0.7 units/ml Monitor platelets by anticoagulation protocol: Yes   Plan:  Heparin gtt 1800 units/hr Daily HL, CBC Confirmatory level this evening Monitor for s/sx bleeding    Agapito GamesAlison Abdur Hoglund, PharmD, BCPS Clinical Pharmacist Pager: [phone] 10/14/2014 1:59 PM

## 2014-10-14 NOTE — ED Notes (Signed)
Post ED Visit - Positive Culture Follow-up: Successful Patient Follow-Up  Culture assessed and recommendations reviewed by: []  Wes Dulaney, Pharm.D., BCPS []  Celedonio MiyamotoJeremy Frens, Pharm.D., BCPS []  Georgina PillionElizabeth Martin, Pharm.D., BCPS []  OakfieldMinh Pham, [address] BoulevardPharm.D., BCPS, AAHIVP []  Estella HuskMichelle Turner, Pharm.D., BCPS, AAHIVP []  Red ChristiansSamson Lee, Pharm.D. []  Tennis Mustassie Stewart, Pharm.D.  Positive wound culture  [x]  Patient discharged without antimicrobial prescription and treatment is now indicated []  Organism is resistant to prescribed ED discharge antimicrobial []  Patient with positive blood cultures  Changes discussed with ED provider: Dierdre ForthHannah Muthersbaugh  New antibiotic prescription needs amoxicillin 500mg  pt tid x 7 days. Called to [employer]  Contacted patient, date 11/16, time 1031   Ashley JacobsFesterman, Nija Koopman C 10/14/2014, 10:30 AM

## 2014-10-14 NOTE — Progress Notes (Signed)
ED Antimicrobial Stewardship Positive Culture Follow Up   Jerome Gomez is an 58 y.o. male who [facility] with a chief complaint of bilateral leg pain Chief Complaint  Patient presents with  . Circulatory Problem    Recent Results (from the past 720 hour(s))  Wound culture     Status: None   Collection Time: 10/11/14  1:05 PM  Result Value Ref Range Status   Specimen Description TOE LT KNEE  Final   Special Requests NONE  Final   Gram Stain   Final    RARE WBC PRESENT, PREDOMINANTLY MONONUCLEAR NO SQUAMOUS EPITHELIAL CELLS SEEN FEW GRAM POSITIVE COCCI IN PAIRS IN CLUSTERS RARE GRAM NEGATIVE RODS Performed at [facility]    Culture   Final    MODERATE GROUP B STREP(S.AGALACTIAE)ISOLATED Note: TESTING AGAINST S. AGALACTIAE NOT ROUTINELY PERFORMED DUE TO PREDICTABILITY OF AMP/PEN/VAN SUSCEPTIBILITY. Performed at [facility]    Report Status 10/13/2014 FINAL  Final    [x]  Patient discharged originally without antimicrobial agent and treatment is now indicated  New antibiotic prescription: amoxicillin 500 mg po TID x7 days  ED Provider: Dierdre ForthHannah Muthersbaugh, PA-C   Derwood KaplanShah, [name], PharmD Candidate  Estella HuskMichelle Turner, PharmD 10/14/2014, 9:19 AM Infectious Diseases Pharmacist Phone# [phone]

## 2014-10-15 ENCOUNTER — Encounter (HOSPITAL_COMMUNITY): Payer: Self-pay | Admitting: *Deleted

## 2014-10-15 ENCOUNTER — Encounter (HOSPITAL_COMMUNITY)
Admission: AD | Disposition: A | Discharge status: Home / Self Care | Payer: Self-pay | Source: Ambulatory Visit | Attending: Cardiology

## 2014-10-15 HISTORY — PX: LOWER EXTREMITY ANGIOGRAM: SHX5508

## 2014-10-15 LAB — GLUCOSE, CAPILLARY
GLUCOSE-CAPILLARY: 236 mg/dL — AB (ref 70–99)
Glucose-Capillary: 156 mg/dL — ABNORMAL HIGH (ref 70–99)
Glucose-Capillary: 136 mg/dL — ABNORMAL HIGH (ref 70–99)
Glucose-Capillary: 136 mg/dL — ABNORMAL HIGH (ref 70–99)
Glucose-Capillary: 132 mg/dL — ABNORMAL HIGH (ref 70–99)

## 2014-10-15 LAB — BASIC METABOLIC PANEL
Anion gap: 12 (ref 5–15)
BUN: 7 mg/dL (ref 6–23)
CO2: 25 mEq/L (ref 19–32)
Calcium: 8.8 mg/dL (ref 8.4–10.5)
Chloride: 102 mEq/L (ref 96–112)
Creatinine, Ser: 0.59 mg/dL (ref 0.50–1.35)
GFR calc Af Amer: >90 mL/min (ref >90)
GFR calc non Af Amer: >90 mL/min (ref >90)
Glucose, Bld: 157 mg/dL — ABNORMAL HIGH (ref 70–99)
POTASSIUM: 3.7 meq/L (ref 3.7–5.3)
Sodium: 139 mEq/L (ref 137–147)

## 2014-10-15 LAB — POCT ACTIVATED CLOTTING TIME
Activated Clotting Time: 236 seconds
Activated Clotting Time: 208 seconds

## 2014-10-15 LAB — CBC
HCT: 35.3 % — ABNORMAL LOW (ref 39.0–52.0)
Hemoglobin: 11.8 g/dL — ABNORMAL LOW (ref 13.0–17.0)
MCH: 29.2 pg (ref 26.0–34.0)
MCHC: 33.4 g/dL (ref 30.0–36.0)
MCV: 87.4 fL (ref 78.0–100.0)
PLATELETS: 357 10*3/uL (ref 150–400)
RBC: 4.04 MIL/uL — AB (ref 4.22–5.81)
RDW: 13.1 % (ref 11.5–15.5)
WBC: 13.2 10*3/uL — ABNORMAL HIGH (ref 4.0–10.5)

## 2014-10-15 LAB — APTT: aPTT: 112 seconds — ABNORMAL HIGH (ref 24–37)

## 2014-10-15 LAB — HEPARIN LEVEL (UNFRACTIONATED)
Heparin Unfractionated: <0.1 IU/mL — ABNORMAL LOW (ref 0.30–0.70)
Heparin Unfractionated: 0.4 IU/mL (ref 0.30–0.70)

## 2014-10-15 LAB — TSH: TSH: 0.809 u[IU]/mL (ref 0.350–4.500)

## 2014-10-15 SURGERY — ANGIOGRAM, LOWER EXTREMITY
Anesthesia: LOCAL

## 2014-10-15 MED ORDER — LIVING WELL WITH DIABETES BOOK
Freq: Once | Status: AC
  Administered 2014-10-15: 13:00:00
  Filled 2014-10-15: qty 1

## 2014-10-15 MED ORDER — ONDANSETRON HCL 4 MG/2ML IJ SOLN: 4.0000 mg | Freq: Four times a day (QID) | INTRAMUSCULAR | Status: DC | PRN

## 2014-10-15 MED ORDER — SODIUM CHLORIDE 0.9 % IV SOLN
INTRAVENOUS | Status: AC
  Administered 2014-10-15 (×2): via INTRAVENOUS

## 2014-10-15 MED ORDER — MIDAZOLAM HCL 2 MG/2ML IJ SOLN
INTRAMUSCULAR | Status: AC
  Filled 2014-10-15: qty 2

## 2014-10-15 MED ORDER — HEPARIN (PORCINE) IN NACL 2-0.9 UNIT/ML-% IJ SOLN
INTRAMUSCULAR | Status: AC
  Filled 2014-10-15: qty 500

## 2014-10-15 MED ORDER — HYDROMORPHONE HCL 1 MG/ML IJ SOLN
INTRAMUSCULAR | Status: AC
  Administered 2014-10-15: 1 mg via INTRAVENOUS
  Filled 2014-10-15: qty 1

## 2014-10-15 MED ORDER — AMPICILLIN SODIUM 2 G IJ SOLR
2.0000 g | Freq: Once | INTRAMUSCULAR | Status: AC
  Administered 2014-10-15: 2 g via INTRAVENOUS
  Filled 2014-10-15: qty 2000

## 2014-10-15 MED ORDER — AMOXICILLIN 500 MG PO CAPS
500.0000 mg | ORAL_CAPSULE | Freq: Three times a day (TID) | ORAL | Status: DC
  Filled 2014-10-15 (×2): qty 1

## 2014-10-15 MED ORDER — SODIUM CHLORIDE 0.9 % IV SOLN
1.0000 g | Freq: Four times a day (QID) | INTRAVENOUS | Status: DC
  Administered 2014-10-15 – 2014-10-16 (×4): 1 g via INTRAVENOUS
  Filled 2014-10-15 (×5): qty 1000

## 2014-10-15 MED ORDER — LIDOCAINE HCL (PF) 1 % IJ SOLN
INTRAMUSCULAR | Status: AC
  Filled 2014-10-15: qty 30

## 2014-10-15 MED ORDER — HEPARIN BOLUS VIA INFUSION
2500.0000 [IU] | Freq: Once | INTRAVENOUS | Status: AC
  Administered 2014-10-15: 2500 [IU] via INTRAVENOUS
  Filled 2014-10-15: qty 2500

## 2014-10-15 MED ORDER — HEPARIN SODIUM (PORCINE) 1000 UNIT/ML IJ SOLN
INTRAMUSCULAR | Status: AC
  Filled 2014-10-15: qty 1

## 2014-10-15 MED ORDER — ACETAMINOPHEN 325 MG PO TABS: 650.0000 mg | ORAL_TABLET | ORAL | Status: DC | PRN

## 2014-10-15 MED ORDER — NITROGLYCERIN 1 MG/10 ML FOR IR/CATH LAB
INTRA_ARTERIAL | Status: AC
  Filled 2014-10-15: qty 10

## 2014-10-15 NOTE — Care Management Note (Unsigned)
    Page 1 of 1   10/15/2014     12:30:20 PM CARE MANAGEMENT NOTE 10/15/2014  Patient:  Jerome Gomez,Jerome Gomez   Account Number:  [id number]  Date Initiated:  10/15/2014  Documentation initiated by:  GRAVES-BIGELOW,Jadae Steinke  Subjective/Objective Assessment:   Pt admitted for Leg pain and ulcer- limb threatening ischemia. Plan for arteriogram and possible angioplasty.     Action/Plan:   CM did speak to pt in regards to PCP and medication assistance. Pt states he works and has insurance, however not worth having. CM did offer to [facility], however he didn't know schedule.   Anticipated DC Date:  10/17/2014   Anticipated DC Plan:  HOME/SELF CARE      DC Planning Services  CM consult      Choice offered to / List presented to:             Status of service:  In process, will continue to follow Medicare Important Message given?  NO (If response is "NO", the following Medicare IM given date fields will be blank) Date Medicare IM given:   Medicare IM given by:   Date Additional Medicare IM given:   Additional Medicare IM given by:    Discharge Disposition:    Per UR Regulation:  Reviewed for med. necessity/level of care/duration of stay  If discussed at Long Length of Stay Meetings, dates discussed:    Comments:  10-15-14 1229 Tomi BambergerBrenda Graves-Bigelow, RN,BN [phone] CM will continue to monitor for disposition needs.

## 2014-10-15 NOTE — Interval H&P Note (Signed)
History and Physical Interval Note:  10/15/2014 1:59 PM  Jerome Gomez  has presented today for surgery, with the diagnosis of pad  The various methods of treatment have been discussed with the patient and family. After consideration of risks, benefits and other options for treatment, the patient has consented to  Procedure(s): LOWER EXTREMITY ANGIOGRAM (N/A) and possible PTA as a surgical intervention .  The patient's history has been reviewed, patient examined, no change in status, stable for surgery.  I have reviewed the patient's chart and labs.  Questions were answered to the patient's satisfaction.     Pamella PertGANJI,JAGADEESH R

## 2014-10-15 NOTE — CV Procedure (Signed)
Procedure performed: Abdominal aortogram, pelvic aortogram and limited bilateral femoral arteriogram, left femoral arteriogram with distal runoff, PTA and stenting of the chronic total occlusion of the left SFA with implantation of a 6.0 x 100 mm  Absolute Pro self-expanding stent.  Indication: Patient is a 58 year old gentleman who was admitted to the hospital with limb threatening ischemia, infarcted left fourth toe, new onset diabetes mellitus, tobacco use disorder. Due to ongoing chest pain, patient was admitted to the hospital and received intravenous heparin. Please then brought to the peripheral angiography suite to evaluate his peripheral anatomy. CT angiogram had revealed short segment occlusion of the left SFA.  Angiographic data: Abdominal aortogram revealed results of 2 renal arteries one on either sides. They're widely patent. Distal abdominal aorta showed mild to moderate amount of atherosclerotic changes. Aortoiliac bifurcation was widely patent. Bilateral common femoral artery and proximal SFA widely patent.  Left femoral arteriogram distal runoff: Proximal left SFA mild disease of 20-30%. Midsegment of the left SFA 100% occluded with collaterals to the distal SFA. Below the left knee, there was three-vessel runoff.  Interventional data: Successful PTA and stenting of the left SFA with implantation of a 6.0 x 100 mm absolute Pro self-expanding stent, stenosis reduced from 100% to 0% with brisk flow noted below the knee.  Recommendation: Patient needs amputation of the left fourth toe. Patient also grew Streptococcus in his blood, presently on IV amoxicillin. I'll obtain consultation from a surgical colleagues regarding further management.  Technique: Using 5 French right femoral arterial access, a 5 French Omnipaque catheter was advanced into the abdominal aorta and abdominal aortogram was performed. Distal abdominal aortogram and limited bifemoral arteriogram was performed and then the  same catheter was utilized to cross over from the right into the left femoral artery. Due to difficulty in advancing the catheter into the left femoral artery, I utilized 0.035 inch Glidewire. With the help of a Glidewire, I then advanced a 5 French angled catheter to the site of stenosis, using heparin for anticoagulation, I attempted to cross the site of chronic total occlusion. In spite of utilization of aggressive measures to cross the occlusion, I had difficulty, I also utilized the back end of the Glidewire, and in spite of this I was unable to cross. I then utilized 135 cm Quickcross catheter, and with the help of a Super Stiff guidewire, as able to cross the site of occlusion. The quick cross catheter was then placed into the popliteal artery, and the Glidewire was exchanged to a Versacore wire.  A 5.0 x 60 mm Powerflex Pro balloon was utilized and balloon angioplasty at 14 atmospheric pressure for 120 seconds 1was performed, followed by stenting the stenosis with a self-expanding stent as dictated above. The stent was again post dilated with the same balloon at 14 atmospheric pressure for 45 seconds 2. Post stenting intra-arterial nitroglycerin 200 mg was administered and angiography was performed. Brisk flow was evident without any edge dissection, the catheter was then pulled out to the right common femoral artery over the wire, right femoral arteriogram was performed and the arterial access was closed with Perclose with excellent hemostasis. Patient tolerated the procedure well. There was no immediate competitions.a total of 144 mL of contrast was utilized for the procedure.

## 2014-10-15 NOTE — Progress Notes (Addendum)
ANTICOAGULATION CONSULT NOTE  Pharmacy Consult for heparin Indication: limb ischemia  No Known Allergies  Patient Measurements: Height: 6' (182.9 cm) Weight: 199 lb 8 oz (90.493 kg) IBW/kg (Calculated) : 77.6IBW: 77.6 kg Heparin Dosing Weight: 91.4 kg  Vital Signs: Temp: 98.8 F (37.1 C) (11/16 2121) Temp Source: Oral (11/16 2121) BP: 140/71 mmHg (11/16 2121) Pulse Rate: 89 (11/16 2121)  Labs:  Recent Labs  10/12/14 0526 10/14/14 1540 10/14/14 2339  HGB 12.7*  --   --   HCT 38.1*  --   --   PLT 345  --   --   LABPROT  --  15.1  --   INR  --  1.17  --   HEPARINUNFRC 0.30  --  <0.10*    Estimated Creatinine Clearance: 113.2 mL/min (by C-G formula based on Cr of 0.6).  Assessment: 58 yo male with PVD awaiting arteriogram for heparin  Goal of Therapy:  Heparin level 0.3-0.7 units/ml Monitor platelets by anticoagulation protocol: Yes   Plan:  Heparin 2500 units IV bolus, then increase heparin 2100 units/hr Follow-up am labs.   Geannie RisenGreg Abbott, PharmD, BCPS   10/15/2014 1:22 AM     Addendum -AM heparin level is therapeutic x1 -F/u confirmatory level and continue daily HL, CBC -Proceeding with arteriogram today    Agapito GamesAlison Ruther Ephraim, PharmD, BCPS Clinical Pharmacist Pager: [phone] 10/15/2014 9:53 AM

## 2014-10-15 NOTE — Plan of Care (Signed)
Problem: Food- and Nutrition-Related Knowledge Deficit (NB-1.1) Goal: Nutrition education Formal process to instruct or train a patient/client in a skill or to impart knowledge to help patients/clients voluntarily manage or modify food choices and eating behavior to maintain or improve health. Outcome: Completed/Met Date Met:  10/15/14  RD consulted for nutrition education regarding diabetes.     Lab Results  Component Value Date    HGBA1C 8.1* 10/11/2014    RD provided "Carbohydrate Counting for People with Diabetes" handout from the Academy of [employer] and Dietetics. Discussed different food groups and their effects on blood sugar, emphasizing carbohydrate-containing foods. Provided list of carbohydrates and recommended serving sizes of common foods.  Discussed importance of controlled and consistent carbohydrate intake throughout the day. Provided examples of ways to balance meals/snacks and encouraged intake of high-fiber, whole grain complex carbohydrates. Teach back method used.  Expect good compliance.  Body mass index is 27.12 kg/(m^2). Pt meets criteria for overweight based on current BMI.  Current diet order is NPO for a procedure. Labs and medications reviewed. No further nutrition interventions warranted at this time. RD contact information provided. If additional nutrition issues arise, please re-consult RD.  Molli Barrows, RD, LDN, [place] Pager [phone] After Hours Pager [phone]

## 2014-10-15 NOTE — Progress Notes (Signed)
UR Completed Walden Statz Graves-Bigelow, RN,BSN [phone]  

## 2014-10-15 NOTE — Progress Notes (Signed)
Attempted to discuss smoking cessation, pt not ready to quit.  Tips for success sheet given and post cath groin instructions given and reviewed.  Pt irritable about being hooked to IV and tele mx and bed not comfortable.  Explained importance of IVF to flush kidneys from iv dye and tele to monitor cardiac status due to CP on admission.  Ambulated to bathroom without difficulty.  IV Dilaudid given for left calf pain, rates 8/10.  Rt groin level 0, proglide drsg D&I.  Left foot red and warm, left 2nd toe black dry and hard.  Bil DP and PT pulses present per doppler.  Tele SR 90's, up to 120 when in bathroom having BM.  Pt has 2 pills on table, states they are aspirin his brother gave him. Advised him of policy of not taking medication from home as it is a safety risk and he is already on medications that thin his blood.  Pt states he will put them away and not take them.  Pt has money laying on table and has been told twice to put money away and hospital is not responsible for lost valuables.  Pt voiced understanding but refused offer to put money in safe. Bed alarm on due to receiving dilaudid.

## 2014-10-16 LAB — GLUCOSE, CAPILLARY
GLUCOSE-CAPILLARY: 126 mg/dL — AB (ref 70–99)
Glucose-Capillary: 144 mg/dL — ABNORMAL HIGH (ref 70–99)

## 2014-10-16 LAB — BASIC METABOLIC PANEL
Anion gap: 13 (ref 5–15)
BUN: 8 mg/dL (ref 6–23)
CHLORIDE: 103 meq/L (ref 96–112)
CO2: 24 mEq/L (ref 19–32)
CREATININE: 0.66 mg/dL (ref 0.50–1.35)
Calcium: 8.7 mg/dL (ref 8.4–10.5)
GFR calc Af Amer: >90 mL/min (ref >90)
GFR calc non Af Amer: >90 mL/min (ref >90)
GLUCOSE: 136 mg/dL — AB (ref 70–99)
POTASSIUM: 3.8 meq/L (ref 3.7–5.3)
Sodium: 140 mEq/L (ref 137–147)

## 2014-10-16 MED ORDER — METFORMIN HCL 500 MG PO TABS: 500.0000 mg | ORAL_TABLET | Freq: Two times a day (BID) | ORAL | Status: AC

## 2014-10-16 MED ORDER — ASPIRIN 81 MG PO TBEC: 81.0000 mg | DELAYED_RELEASE_TABLET | Freq: Every day | ORAL | Status: AC

## 2014-10-16 MED ORDER — ATORVASTATIN CALCIUM 80 MG PO TABS: 80.0000 mg | ORAL_TABLET | Freq: Every day | ORAL | Status: AC

## 2014-10-16 MED ORDER — LIVING WELL WITH DIABETES BOOK
Freq: Once | Status: AC
  Administered 2014-10-16: 13:00:00
  Filled 2014-10-16: qty 1

## 2014-10-16 MED ORDER — LIVING WELL WITH DIABETES BOOK: 1.0000 | Freq: Once | Status: AC

## 2014-10-16 MED ORDER — AMPICILLIN 500 MG PO CAPS: 500.0000 mg | ORAL_CAPSULE | Freq: Four times a day (QID) | ORAL | Status: AC

## 2014-10-16 MED ORDER — OXYCODONE-ACETAMINOPHEN 10-325 MG PO TABS: 1.0000 | ORAL_TABLET | ORAL | Status: AC | PRN

## 2014-10-16 MED ORDER — CLOPIDOGREL BISULFATE 75 MG PO TABS: 75.0000 mg | ORAL_TABLET | Freq: Every day | ORAL | Status: AC

## 2014-10-16 NOTE — Progress Notes (Addendum)
Inpatient Diabetes Program Recommendations  AACE/ADA: New Consensus Statement on Inpatient Glycemic Control (2013)  Target Ranges:  Prepandial:   less than 140 mg/dL      Peak postprandial:   less than 180 mg/dL (1-2 hours)      Critically ill patients:  140 - 180 mg/dL     Results for [name], Jerome Gomez (MRN [id number]) as of 10/16/2014 10:56  Ref. Range 10/15/2014 07:20 10/15/2014 11:28 10/15/2014 15:46 10/15/2014 17:46 10/15/2014 21:42  Glucose-Capillary Latest Range: 70-99 mg/dL 147156 (H) 829136 (H) 562136 (H) 132 (H) 236 (H)    Results for [name], Jerome Gomez (MRN [id number]) as of 10/16/2014 10:56  Ref. Range 10/11/2014 14:48  Hgb A1c MFr Bld Latest Range: <5.7 % 8.1 (H)     Admitted with LE pain and ulcer.  Diagnosed with DM this admission.  A1c 8.1%.    Spoke with pt about new diagnosis.  Discussed A1C results with him and explained what an A1C is, basic pathophysiology of DM Type 2, basic home care, importance of checking CBGs and maintaining good CBG control to prevent long-term and short-term complications.  Reviewed signs and symptoms of hyperglycemia and hypoglycemia and also reviewed blood sugar goals at home with patient.  Encouraged patient to check CBGs at least once daily at home and to record all CBGs for his PCP.  RNs to provide ongoing basic DM education at bedside with this patient.  Have ordered educational booklet and DM videos.  RD has also seen patient for basic DM diet education as well.  Patient told me he has been checking his CBGs at home for a while now.  Reviewed proper technique of CBG checking at home.  Also gave patient information on purchasing an inexpensive CBG meter and strips [facility]  Meter is $16 and a box of 50 strips is $9.  Also told patient about inexpensive CBG meter and supplies at [facility] and other local pharmacies.  Patient stated he has watched the DM videos on the patient education network already.  Did not have any further questions for me at  this time.  Encouraged patient to [facility] so he can be seen for DM management at least every 6 months.   MD- Please consider initiating Metformin 500 mg bid for home use    Will follow Ambrose FinlandJeannine Johnston Ibrahima Holberg RN, MSN, CDE Diabetes Coordinator Inpatient Diabetes Program Team Pager: [phone] (8a-10p)

## 2014-10-16 NOTE — Consult Note (Signed)
Reason for Consult:gangrene left foot fourth toe Referring Physician: Dr. Artemio Aly is an 58 y.o. male.  HPI: patient is a 58 year old gentleman with peripheral vascular disease and diabetes. Most recent hemoglobin A1c is greater than 8. Patient was initially seen last week and was seen with vascular surgery with critical limb ischemia to the left foot. Recommendation was for revascularization surgery. Patient left the hospital and presents at this time he is status post stent placement for the left lower extremity.  Past Medical History  Diagnosis Date  . Gout   . New onset type 2 diabetes mellitus 10/14/2014    Past Surgical History  Procedure Laterality Date  . Bone cyst excision      History reviewed. No pertinent family history.  Social History:  reports that he has been smoking Cigarettes.  He has a 45 pack-year smoking history. He has never used smokeless tobacco. He reports that he does not drink alcohol or use illicit drugs.  Allergies: No Known Allergies  Medications: I have reviewed the patient's current medications.  Results for orders placed or performed during the hospital encounter of 10/14/14 (from the past 48 hour(s))  Lipid panel     Status: Abnormal   Collection Time: 10/14/14  3:40 PM  Result Value Ref Range   Cholesterol 206 (H) 0 - 200 mg/dL   Triglycerides 141 <150 mg/dL   HDL 35 (L) >39 mg/dL   Total CHOL/HDL Ratio 5.9 RATIO   VLDL 28 0 - 40 mg/dL   LDL Cholesterol 143 (H) 0 - 99 mg/dL    Comment:        Total Cholesterol/HDL:CHD Risk Coronary Heart Disease Risk Table                     Men   Women  1/2 Average Risk   3.4   3.3  Average Risk       5.0   4.4  2 X Average Risk   9.6   7.1  3 X Average Risk  23.4   11.0        Use the calculated Patient Ratio above and the CHD Risk Table to determine the patient's CHD Risk.        ATP III CLASSIFICATION (LDL):  <100     mg/dL   Optimal  100-129  mg/dL   Near or Above        Optimal  130-159  mg/dL   Borderline  160-189  mg/dL   High  >190     mg/dL   Very High   Protime-INR     Status: None   Collection Time: 10/14/14  3:40 PM  Result Value Ref Range   Prothrombin Time 15.1 11.6 - 15.2 seconds   INR 1.17 0.00 - 1.49  hCG, serum, qualitative     Status: None   Collection Time: 10/14/14  3:40 PM  Result Value Ref Range   Preg, Serum NEGATIVE NEGATIVE    Comment:        THE SENSITIVITY OF THIS METHODOLOGY IS >10 mIU/mL.   Glucose, capillary     Status: Abnormal   Collection Time: 10/14/14  4:37 PM  Result Value Ref Range   Glucose-Capillary 188 (H) 70 - 99 mg/dL  Glucose, capillary     Status: Abnormal   Collection Time: 10/14/14  9:18 PM  Result Value Ref Range   Glucose-Capillary 207 (H) 70 - 99 mg/dL  Heparin level (unfractionated)  Status: Abnormal   Collection Time: 10/14/14 11:39 PM  Result Value Ref Range   Heparin Unfractionated <0.10 (L) 0.30 - 0.70 IU/mL    Comment:        IF HEPARIN RESULTS ARE BELOW EXPECTED VALUES, AND PATIENT DOSAGE HAS BEEN CONFIRMED, SUGGEST FOLLOW UP TESTING OF ANTITHROMBIN III LEVELS.   Glucose, capillary     Status: Abnormal   Collection Time: 10/15/14  7:20 AM  Result Value Ref Range   Glucose-Capillary 156 (H) 70 - 99 mg/dL  APTT     Status: Abnormal   Collection Time: 10/15/14  8:06 AM  Result Value Ref Range   aPTT 112 (H) 24 - 37 seconds    Comment:        IF BASELINE aPTT IS ELEVATED, SUGGEST PATIENT RISK ASSESSMENT BE USED TO DETERMINE APPROPRIATE ANTICOAGULANT THERAPY.   Basic metabolic panel     Status: Abnormal   Collection Time: 10/15/14  8:06 AM  Result Value Ref Range   Sodium 139 137 - 147 mEq/L   Potassium 3.7 3.7 - 5.3 mEq/L   Chloride 102 96 - 112 mEq/L   CO2 25 19 - 32 mEq/L   Glucose, Bld 157 (H) 70 - 99 mg/dL   BUN 7 6 - 23 mg/dL   Creatinine, Ser 0.59 0.50 - 1.35 mg/dL   Calcium 8.8 8.4 - 10.5 mg/dL   GFR calc non Af Amer >90 >90 mL/min   GFR calc Af Amer >90  >90 mL/min    Comment: (NOTE) The eGFR has been calculated using the CKD EPI equation. This calculation has not been validated in all clinical situations. eGFR's persistently <90 mL/min signify possible Chronic Kidney Disease.    Anion gap 12 5 - 15  CBC     Status: Abnormal   Collection Time: 10/15/14  8:06 AM  Result Value Ref Range   WBC 13.2 (H) 4.0 - 10.5 K/uL   RBC 4.04 (L) 4.22 - 5.81 MIL/uL   Hemoglobin 11.8 (L) 13.0 - 17.0 g/dL   HCT 35.3 (L) 39.0 - 52.0 %   MCV 87.4 78.0 - 100.0 fL   MCH 29.2 26.0 - 34.0 pg   MCHC 33.4 30.0 - 36.0 g/dL   RDW 13.1 11.5 - 15.5 %   Platelets 357 150 - 400 K/uL  TSH     Status: None   Collection Time: 10/15/14  8:06 AM  Result Value Ref Range   TSH 0.809 0.350 - 4.500 uIU/mL  Heparin level (unfractionated)     Status: None   Collection Time: 10/15/14  8:06 AM  Result Value Ref Range   Heparin Unfractionated 0.40 0.30 - 0.70 IU/mL    Comment:        IF HEPARIN RESULTS ARE BELOW EXPECTED VALUES, AND PATIENT DOSAGE HAS BEEN CONFIRMED, SUGGEST FOLLOW UP TESTING OF ANTITHROMBIN III LEVELS.   Glucose, capillary     Status: Abnormal   Collection Time: 10/15/14 11:28 AM  Result Value Ref Range   Glucose-Capillary 136 (H) 70 - 99 mg/dL  POCT Activated clotting time     Status: None   Collection Time: 10/15/14  2:24 PM  Result Value Ref Range   Activated Clotting Time 236 seconds  POCT Activated clotting time     Status: None   Collection Time: 10/15/14  2:37 PM  Result Value Ref Range   Activated Clotting Time 208 seconds  Glucose, capillary     Status: Abnormal   Collection Time: 10/15/14  3:46 PM  Result Value Ref Range   Glucose-Capillary 136 (H) 70 - 99 mg/dL  Glucose, capillary     Status: Abnormal   Collection Time: 10/15/14  5:46 PM  Result Value Ref Range   Glucose-Capillary 132 (H) 70 - 99 mg/dL  Glucose, capillary     Status: Abnormal   Collection Time: 10/15/14  9:42 PM  Result Value Ref Range   Glucose-Capillary 236  (H) 70 - 99 mg/dL   Comment 1 Notify RN    Comment 2 Documented in Chart   Basic metabolic panel      Status: Abnormal   Collection Time: 10/16/14  2:55 AM  Result Value Ref Range   Sodium 140 137 - 147 mEq/L   Potassium 3.8 3.7 - 5.3 mEq/L   Chloride 103 96 - 112 mEq/L   CO2 24 19 - 32 mEq/L   Glucose, Bld 136 (H) 70 - 99 mg/dL   BUN 8 6 - 23 mg/dL   Creatinine, Ser 0.66 0.50 - 1.35 mg/dL   Calcium 8.7 8.4 - 10.5 mg/dL   GFR calc non Af Amer >90 >90 mL/min   GFR calc Af Amer >90 >90 mL/min    Comment: (NOTE) The eGFR has been calculated using the CKD EPI equation. This calculation has not been validated in all clinical situations. eGFR's persistently <90 mL/min signify possible Chronic Kidney Disease.    Anion gap 13 5 - 15  Glucose, capillary     Status: Abnormal   Collection Time: 10/16/14  7:10 AM  Result Value Ref Range   Glucose-Capillary 126 (H) 70 - 99 mg/dL   Comment 1 Notify RN    Comment 2 Documented in Chart     No results found.  Review of Systems  All other systems reviewed and are negative.  Blood pressure 126/79, pulse 97, temperature 98 F (36.7 C), temperature source Oral, resp. rate 20, height 6' (1.829 m), weight 91.3 kg (201 lb 4.5 oz), SpO2 97 %. Physical Exam On examination patient has dermatitis involving the entire left foot. He has a soft ulcer over the heel but this is not broken down the areas approximately 2 cm in diameter but no cellulitis. 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 Using the Doppler patient has a strong biphasic dorsalis pedis and posterior tibial pulse. Patient's ankle-brachial indices a week ago he had a dampened monophasic pulse with an ABI of approximately 27%. 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  Plan: We will plan for fourth ray amputation left foot. Discussed with the patient that there is a risk for further ulceration in his foot risk for the wound not  healing risk for additional surgery either from a vascular standpoint or an orthopedic standpoint. Patient states he understands wishes to proceed at this time. Patient is discharged from the hospital today and I will make arrangements for outpatient surgery at [facility] on Friday  Laurita Peron V 10/16/2014, 11:32 AM

## 2014-10-16 NOTE — Progress Notes (Signed)
Monitor alarmed for leads off at approx. 0900.  Went and found patient gone from room.  Nurse tech said that he'd commented earlier about his charger being in his car.  He returned to his room about 0930, saying he had been to his car.  I explained to him why this was not advisable behavior.

## 2014-10-16 NOTE — Discharge Summary (Signed)
Physician Discharge Summary  Patient ID: Jerome Gomez MRN: [id number] DOB/AGE: [birth year] 58 y.o.  Admit date: 10/14/2014 Discharge date: 10/16/2014  Primary Discharge Diagnosis 1. Peripheral vascular disease with pain at rest  ABI 10/11/2014: Right ABI 0.73. Monophasic waveforms. Left ABI severely dampened monophasic a form, severely reduced diffusion to the left leg, ABI 0.27. 2. Diabetes mellitus type 2, uncontrolled  HbA1c 10/11/2014: 8.1%. CBC 10/11/2014: HB 4.7/HCT 38.1. White count elevated at 13.3. Normal platelet count at 345. BMP revealed elevated blood sugar at 194 mg, BUN 5, serum creatinine 0.60. 3. Tobacco use disorder, continuous  4.  Hyperlipidemia  Consultations:  Dr. Aldean Baker: Reason for Consult:gangrene left foot fourth toe. Plan: We will plan for fourth ray amputation left foot. Discussed with the patient that there is a risk for further ulceration in his foot risk for the wound not healing risk for additional surgery either from a vascular standpoint or an orthopedic standpoint. Patient states he understands wishes to proceed at this time. Patient is discharged from the hospital today and I will make arrangements for outpatient surgery at [facility] hospital on Friday    Significant Diagnostic Studies: 10/15/2014 Peripheral arteriogram: Angiographic data: Abdominal aortogram revealed results of 2 renal arteries one on either sides. They're widely patent. Distal abdominal aorta showed mild to moderate amount of atherosclerotic changes. Aortoiliac bifurcation was widely patent. Bilateral common femoral artery and proximal SFA widely patent.  Left femoral arteriogram distal runoff: Proximal left SFA mild disease of 20-30%. Midsegment of the left SFA 100% occluded with collaterals to the distal SFA. Below the left knee, there was three-vessel runoff.  Interventional data: Successful PTA and stenting of the left SFA with implantation of a 6.0 x 100 mm absolute Pro  self-expanding stent, stenosis reduced from 100% to 0% with brisk flow noted below the knee.  Consults:   Hospital Course: The patient is a 58 year old male who presents with peripheral vascular disease. Patient is a 58 year old Caucasian male with history of tobacco use disorder, has about a 50-60-pack-year history of smoking, who was recently admitted to [facility] on 10/11/2014 with limb threatening ischemia, tenderness left fourth toe, was evaluated by vascular surgery and recommended lower extremity bypass surgery. Patient walked out AMA as he did not want surgery and wanted endovascular evaluation prior to surgery. He is now being seen in her office at his request on an urgent basis.  He has not seen a physician in years. Does not take any medications.  On further questioning, patient has had bilateral claudication symptoms in his calves ongoing for the past one year. However about a week ago he started having rest and and eventually noticed his feet was getting much more red and also painful. No fever, no other associated symptoms. He denies any chest pain or shortness of breath. Smokes about 1.5 pack of cigarettes a day.   He was seen in our office on 10/14/2014 and was admitted to the hospital for acute limb ischemia management.  He underwent arteriogram on 10/15/2014, underwent successful angioplasty to occluded left SFA and the following morning he had bounding pulse in the left dorsalis pedis.  His pain had decreased significantly.  I obtained consultation from Dr. Lajoyce Corners regarding management of left fourth toe which is gangrenous.  It was felt that he was stable for discharge  With outpatient surgery and amputation.  Due to Streptococcus in his leg wound culture, we also started him on ampicillin 500 mg by mouth 4 times a  day.smoking cessation was discussed extensively.  Discharge Exam: Blood pressure 126/79, pulse 97, temperature 98 F (36.7 C), temperature source Oral,  resp. rate 20, height 6' (1.829 m), weight 91.3 kg (201 lb 4.5 oz), SpO2 97 %.  neral appearance: alert, appears stated age and no distress Eyes: negative findings: conjunctivae and sclerae normal Neck: no carotid bruit, no JVD, supple, symmetrical, trachea midline and thyroid not enlarged, symmetric, no tenderness/mass/nodules Neck: JVP - normal, carotids 2+= without bruits Resp: clear to auscultation bilaterally Chest wall: no tenderness Cardio: regular rate and rhythm, S1, S2 normal, no murmur, click, rub or gallop GI: soft, non-tender; bowel sounds normal; no masses, no organomegaly Extremities: Please see vascular exam. Trace left leg edema and erythema. 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  Pulses: Right Pulses: FEM: present 2+, POP: absent, DP: absent, PT: absent Left Pulses: FEM: present 2+, POP 2 plus , DP:2 plus, PT: faint   Labs:   Lab Results  Component Value Date   WBC 13.2* 10/15/2014   HGB 11.8* 10/15/2014   HCT 35.3* 10/15/2014   MCV 87.4 10/15/2014   PLT 357 10/15/2014    Recent Labs Lab 10/16/14 0255  NA 140  K 3.8  CL 103  CO2 24  BUN 8  CREATININE 0.66  CALCIUM 8.7  GLUCOSE 136*   No results found for: CKTOTAL, CKMB, CKMBINDEX, TROPONINI  Lipid Panel     Component Value Date/Time   CHOL 206* 10/14/2014 1540   TRIG 141 10/14/2014 1540   HDL 35* 10/14/2014 1540   CHOLHDL 5.9 10/14/2014 1540   VLDL 28 10/14/2014 1540   LDLCALC 143* 10/14/2014 1540   HbA1c 10/11/2014: 8.1%.   EKG: Normal sinus rhythm, normal intervals.  No evidence of ischemia.    Radiology: Dg Chest 2 View  10/11/2014   CLINICAL DATA:  Preop cardiovascular exam, smoker, gangrene of digit  EXAM: CHEST  2 VIEW  COMPARISON:  None.  FINDINGS: Cardiomediastinal silhouette is unremarkable. No acute infiltrate or pleural effusion. No pulmonary edema. Bony thorax is unremarkable.  IMPRESSION: No active cardiopulmonary disease.   Electronically Signed   By: Natasha Mead M.D.   On: 10/11/2014 18:38    Dg Tibia/fibula Left  10/11/2014   CLINICAL DATA:  Lower extremity swelling with redness, no known trauma, initial encounter  EXAM: LEFT TIBIA AND FIBULA - 2 VIEW  COMPARISON:  None.  FINDINGS: No acute fracture or dislocation is noted. Small calcaneal spurs are seen. No gross soft tissue abnormality is noted.  IMPRESSION: No acute abnormality noted.   Electronically Signed   By: Alcide Clever M.D.   On: 10/11/2014 11:49   Ct Angio Ao+bifem W/cm &/or Wo/cm  10/11/2014   CLINICAL DATA:  Left foot ischemia with purple fourth toe, check for possible embolic source  EXAM: CT ANGIOGRAPHY OF ABDOMINAL AORTA WITH ILIOFEMORAL RUNOFF  TECHNIQUE: Multidetector CT imaging of the abdomen, pelvis and lower extremities was performed using the standard protocol during bolus administration of intravenous contrast. Multiplanar CT image reconstructions and MIPs were obtained to evaluate the vascular anatomy.  CONTRAST:  50mL OMNIPAQUE IOHEXOL 350 MG/ML SOLN, OMNIPAQUE IOHEXOL 350 MG/ML SOLN  COMPARISON:  None.  FINDINGS: Aorta: The abdominal aorta is well visualized and demonstrates some irregular soft atherosclerotic plaque throughout the infrarenal segment. Very mild ectasia is noted with maximum diameter of 3.0 x 2.9 cm. Given the irregular nature of the atherosclerotic plaque this is likely the etiology of the patient's embolic disease. The celiac axis, superior mesenteric artery  and inferior mesenteric artery are all widely patent. Single renal arteries are identified bilaterally without focal stenosis.  Right Lower Extremity: The right common iliac artery demonstrates some calcified and noncalcified plaque. No focal stenosis is noted. The external iliac artery shows similar findings. There is stenosis of approximately 50% at the origin of the right superficial femoral artery. Beyond this focal stenosis superficial femoral artery is within normal limits. At the level of the adductor canal however there is a focal  concentric high-grade stenosis identified with subsequent short segment occlusion. Reconstitution of the proximal popliteal artery is noted via muscular collaterals. The popliteal artery is patent but demonstrates some narrowing distally. The popliteal trifurcation is patent. The anterior tibial artery is occluded just beyond its origin. The peroneal and posterior tibial arteries are patent to the level of the ankle.  Left Lower Extremity: The left common external iliac arteries show similar findings to that on the right. A focal 40-50% stenosis of the proximal external iliac artery is noted on the left. Common femoral bifurcation is patent. The superficial femoral artery demonstrates scattered atherosclerotic change throughout its course with focal short segment occlusion at the level of the adductor canal. Multiple muscular collaterals reconstitute the popliteal artery after approximately 2-3 cm. It demonstrates scattered atherosclerotic change as well. The trifurcation of the popliteal artery on the left is widely patent with 2 vessel runoff to the level of the ankle. The peroneal artery occludes in the mid to distal calf. Anterior tibial artery is difficult to follow in the foot. Posterior tibial artery extends to at least the level of the metatarsal heads. More distal arterial visualization is difficult.  Review of the MIP images confirms the above findings.  Nonvascular: The liver is mildly fatty infiltrated. The gallbladder, spleen, adrenal glands and pancreas are all normal in their CT appearance. The kidneys demonstrate a normal enhancement pattern. A few small cysts are noted. No obstructive changes are seen. Small periumbilical fat containing hernia is noted. No bowel is noted within.  Scattered diverticular change is seen without diverticulitis. The appendix and bladder are within normal limits as visualized. No significant lymphadenopathy is noted. No acute bony abnormality is seen.  IMPRESSION:  Irregular soft atherosclerotic plaque within the abdominal aorta likely the source of the distal emboli.  Bilateral superficial femoral/popliteal artery occlusion at the level of the adductor canal. Additionally there is a 50% stenosis of the proximal right superficial femoral artery at its origin.  Two vessel runoff bilaterally involving the peroneal and posterior tibial artery on the right and the anterior and posterior tibial arteries on the left.  Chronic nonvascular changes as described above.   Electronically Signed   By: Alcide CleverMark  Lukens M.D.   On: 10/11/2014 19:12   Dg Foot Complete Left  10/11/2014   CLINICAL DATA:  Pain with soft tissue swelling. Evidence of bruising. No known trauma.  EXAM: LEFT FOOT - COMPLETE 3+ VIEW  COMPARISON:  None.  FINDINGS: Frontal, oblique, and lateral views were obtained. There is no demonstrable fracture or dislocation. There is mild narrowing of the first MTP joint as well as all DIP joints. There is no erosive change or bony destruction. There is an os naviculare, an anatomic variant. There is a small posterior calcaneal spur.  IMPRESSION: Narrowing of several distal joints. No fracture or dislocation. No erosive change or bony destruction.   Electronically Signed   By: Bretta BangWilliam  Woodruff M.D.   On: 10/11/2014 11:44   Ct Angio Chest Aortic Dissect W &/or  W/o  10/11/2014   CLINICAL DATA:  History of left foot ischemia, check for embolic source.  EXAM: CT ANGIOGRAPHY CHEST WITH CONTRAST  TECHNIQUE: Multidetector CT imaging of the chest was performed using the standard protocol during bolus administration of intravenous contrast. Multiplanar CT image reconstructions and MIPs were obtained to evaluate the vascular anatomy.  CONTRAST:  50mL OMNIPAQUE IOHEXOL 350 MG/ML SOLN, 100mL OMNIPAQUE IOHEXOL 350 MG/ML SOLN  COMPARISON:  None.  FINDINGS: The lungs are well aerated bilaterally without focal infiltrate or sizable effusion. No parenchymal nodule is noted.  The thoracic inlet  shows some mild heterogeneity to the thyroid without definitive nodule. The thoracic aorta show some mild calcific changes. Very mild soft atherosclerotic plaque is noted in the descending thoracic aorta. No dissection or aneurysmal dilatation is seen. The origins of the brachiocephalic vessels are within normal limits.  The cardiac structures show no acute abnormality. No large thrombus is noted within the left atrium. No hilar or mediastinal adenopathy is seen. No gross pulmonary embolism is seen. The osseous structures as visualized are within normal limits.  Review of the MIP images confirms the above findings.  IMPRESSION: Mild atherosclerotic changes within the thoracic aorta without aneurysmal dilatation or dissection. No definitive source for embolic material is identified.  No other focal abnormality is noted.   Electronically Signed   By: Alcide CleverMark  Lukens M.D.   On: 10/11/2014 18:59      FOLLOW UP PLANS AND APPOINTMENTS Discharge Instructions    Ambulatory referral to Nutrition and Diabetic Education    Complete by:  As directed   A1c 8.1%.  New diagnosis of DM this admission.            Medication List    STOP taking these medications        OVER THE COUNTER MEDICATION      TAKE these medications        ampicillin 500 MG capsule  Commonly known as:  PRINCIPEN  Take 1 capsule (500 mg total) by mouth 4 (four) times daily.     aspirin 81 MG EC tablet  Take 1 tablet (81 mg total) by mouth daily.     atorvastatin 80 MG tablet  Commonly known as:  LIPITOR  Take 1 tablet (80 mg total) by mouth daily at 6 PM.     clopidogrel 75 MG tablet  Commonly known as:  PLAVIX  Take 1 tablet (75 mg total) by mouth daily.     living well with diabetes book Misc  1 each by Does not apply route once.     metFORMIN 500 MG tablet  Commonly known as:  GLUCOPHAGE  Take 1 tablet (500 mg total) by mouth 2 (two) times daily with a meal.     oxyCODONE-acetaminophen 10-325 MG per tablet   Commonly known as:  PERCOCET  Take 1 tablet by mouth every 4 (four) hours as needed for pain.     traMADol 50 MG tablet  Commonly known as:  ULTRAM  Take 100 mg by mouth every 6 (six) hours as needed for moderate pain.     VITAMIN C PO  Take 2 tablets by mouth once.           Follow-up Information    Follow up with DUDA,MARCUS V, MD In 2 weeks.   Specialty:  Orthopedic Surgery   Contact information:   [address] [place] [address] [phone]       Follow up with Pamella PertGANJI,JAGADEESH R, MD.  Specialty:  Cardiology   Why:  To be seen in 2 weeks   Contact information:   [address] [place] [address] [phone]        Pamella Pert, MD 10/16/2014, 11:49 AM  Pager: [phone] Office: [phone] If no answer: [phone]

## 2014-10-16 NOTE — Progress Notes (Signed)
Pt smoking in room.  Reminded pt of no smoking policy and and smoking in vicinity of oxygen use in hospital could cause fire.  Pt irritable and agitated, voices that he is doing well for going 2 days without smoking.  Asked pt to refrain from smoking for duration of his stay in hospital.

## 2014-10-17 ENCOUNTER — Encounter (HOSPITAL_COMMUNITY): Payer: Self-pay | Admitting: *Deleted

## 2014-10-18 ENCOUNTER — Encounter (HOSPITAL_COMMUNITY): Payer: Self-pay | Admitting: *Deleted

## 2014-10-18 ENCOUNTER — Ambulatory Visit (HOSPITAL_COMMUNITY): Payer: No Typology Code available for payment source | Admitting: Anesthesiology

## 2014-10-18 ENCOUNTER — Encounter (HOSPITAL_COMMUNITY)
Admission: RE | Disposition: A | Discharge status: Home / Self Care | Payer: Self-pay | Source: Ambulatory Visit | Attending: Orthopedic Surgery

## 2014-10-18 ENCOUNTER — Ambulatory Visit (HOSPITAL_COMMUNITY)
Admission: RE | Admit: 2014-10-18 | Discharge: 2014-10-18 | Disposition: A | Discharge status: Home / Self Care | Payer: No Typology Code available for payment source | Source: Ambulatory Visit | Attending: Orthopedic Surgery | Admitting: Orthopedic Surgery

## 2014-10-18 DIAGNOSIS — E119 Type 2 diabetes mellitus without complications: Secondary | ICD-10-CM | POA: Diagnosis not present

## 2014-10-18 DIAGNOSIS — I96 Gangrene, not elsewhere classified: Secondary | ICD-10-CM | POA: Diagnosis present

## 2014-10-18 DIAGNOSIS — F17200 Nicotine dependence, unspecified, uncomplicated: Secondary | ICD-10-CM | POA: Insufficient documentation

## 2014-10-18 DIAGNOSIS — M109 Gout, unspecified: Secondary | ICD-10-CM | POA: Diagnosis not present

## 2014-10-18 DIAGNOSIS — I739 Peripheral vascular disease, unspecified: Secondary | ICD-10-CM | POA: Insufficient documentation

## 2014-10-18 HISTORY — PX: AMPUTATION: SHX166

## 2014-10-18 HISTORY — DX: Peripheral vascular disease, unspecified: I73.9

## 2014-10-18 LAB — GLUCOSE, CAPILLARY
GLUCOSE-CAPILLARY: 116 mg/dL — AB (ref 70–99)
Glucose-Capillary: 148 mg/dL — ABNORMAL HIGH (ref 70–99)
Glucose-Capillary: 117 mg/dL — ABNORMAL HIGH (ref 70–99)

## 2014-10-18 SURGERY — AMPUTATION, FOOT, RAY
Anesthesia: General | Laterality: Left

## 2014-10-18 MED ORDER — FENTANYL CITRATE 0.05 MG/ML IJ SOLN
INTRAMUSCULAR | Status: DC | PRN
  Administered 2014-10-18: 50 ug via INTRAVENOUS

## 2014-10-18 MED ORDER — MIDAZOLAM HCL 2 MG/2ML IJ SOLN
INTRAMUSCULAR | Status: AC
  Filled 2014-10-18: qty 2

## 2014-10-18 MED ORDER — PHENYLEPHRINE 40 MCG/ML (10ML) SYRINGE FOR IV PUSH (FOR BLOOD PRESSURE SUPPORT)
PREFILLED_SYRINGE | INTRAVENOUS | Status: AC
  Filled 2014-10-18: qty 20

## 2014-10-18 MED ORDER — ONDANSETRON HCL 4 MG/2ML IJ SOLN
INTRAMUSCULAR | Status: AC
  Filled 2014-10-18: qty 2

## 2014-10-18 MED ORDER — DEXMEDETOMIDINE HCL IN NACL 200 MCG/50ML IV SOLN
INTRAVENOUS | Status: AC
Start: 2014-10-18 — End: 2014-10-18
  Filled 2014-10-18: qty 50

## 2014-10-18 MED ORDER — FENTANYL CITRATE 0.05 MG/ML IJ SOLN
INTRAMUSCULAR | Status: AC
  Filled 2014-10-18: qty 5

## 2014-10-18 MED ORDER — PROPOFOL 10 MG/ML IV BOLUS
INTRAVENOUS | Status: DC | PRN
  Administered 2014-10-18: 120 mg via INTRAVENOUS

## 2014-10-18 MED ORDER — ARTIFICIAL TEARS OP OINT
TOPICAL_OINTMENT | OPHTHALMIC | Status: AC
  Filled 2014-10-18: qty 3.5

## 2014-10-18 MED ORDER — CEFAZOLIN SODIUM-DEXTROSE 2-3 GM-% IV SOLR
INTRAVENOUS | Status: AC
  Filled 2014-10-18: qty 50

## 2014-10-18 MED ORDER — LACTATED RINGERS IV SOLN
INTRAVENOUS | Status: DC | PRN
  Administered 2014-10-18 (×2): via INTRAVENOUS

## 2014-10-18 MED ORDER — FENTANYL CITRATE 0.05 MG/ML IJ SOLN: 25.0000 ug | INTRAMUSCULAR | Status: DC | PRN

## 2014-10-18 MED ORDER — GLYCOPYRROLATE 0.2 MG/ML IJ SOLN
INTRAMUSCULAR | Status: AC
  Filled 2014-10-18: qty 2

## 2014-10-18 MED ORDER — LACTATED RINGERS IV SOLN
INTRAVENOUS | Status: DC
  Administered 2014-10-18: 14:00:00 via INTRAVENOUS

## 2014-10-18 MED ORDER — ASPIRIN EC 325 MG PO TBEC: 325.0000 mg | DELAYED_RELEASE_TABLET | Freq: Every day | ORAL | Status: AC

## 2014-10-18 MED ORDER — CEFAZOLIN SODIUM-DEXTROSE 2-3 GM-% IV SOLR
INTRAVENOUS | Status: DC | PRN
  Administered 2014-10-18: 2 g via INTRAVENOUS

## 2014-10-18 MED ORDER — ROCURONIUM BROMIDE 50 MG/5ML IV SOLN
INTRAVENOUS | Status: AC
Start: 2014-10-18 — End: 2014-10-18
  Filled 2014-10-18: qty 1

## 2014-10-18 MED ORDER — ONDANSETRON HCL 4 MG/2ML IJ SOLN
INTRAMUSCULAR | Status: DC | PRN
  Administered 2014-10-18: 4 mg via INTRAVENOUS

## 2014-10-18 MED ORDER — MEPERIDINE HCL 25 MG/ML IJ SOLN: 6.2500 mg | INTRAMUSCULAR | Status: DC | PRN

## 2014-10-18 MED ORDER — DEXAMETHASONE SODIUM PHOSPHATE 4 MG/ML IJ SOLN
INTRAMUSCULAR | Status: AC
  Filled 2014-10-18: qty 2

## 2014-10-18 MED ORDER — PROPOFOL 10 MG/ML IV BOLUS
INTRAVENOUS | Status: AC
  Filled 2014-10-18: qty 20

## 2014-10-18 MED ORDER — LIDOCAINE HCL (CARDIAC) 20 MG/ML IV SOLN
INTRAVENOUS | Status: DC | PRN
  Administered 2014-10-18: 80 mg via INTRAVENOUS

## 2014-10-18 MED ORDER — MIDAZOLAM HCL 5 MG/5ML IJ SOLN
INTRAMUSCULAR | Status: DC | PRN
  Administered 2014-10-18: 2 mg via INTRAVENOUS

## 2014-10-18 MED ORDER — PROMETHAZINE HCL 25 MG/ML IJ SOLN: 6.2500 mg | INTRAMUSCULAR | Status: DC | PRN

## 2014-10-18 SURGICAL SUPPLY — 30 items
BLADE SAW SGTL MED 73X18.5 STR (BLADE) IMPLANT
BNDG COHESIVE 1X5 TAN STRL LF (GAUZE/BANDAGES/DRESSINGS) ×3 IMPLANT
BNDG COHESIVE 4X5 TAN STRL (GAUZE/BANDAGES/DRESSINGS) ×3 IMPLANT
BNDG GAUZE ELAST 4 BULKY (GAUZE/BANDAGES/DRESSINGS) ×3 IMPLANT
COVER SURGICAL LIGHT HANDLE (MISCELLANEOUS) ×3 IMPLANT
DRAPE U-SHAPE 47X51 STRL (DRAPES) ×6 IMPLANT
DRSG ADAPTIC 3X8 NADH LF (GAUZE/BANDAGES/DRESSINGS) ×3 IMPLANT
DRSG PAD ABDOMINAL 8X10 ST (GAUZE/BANDAGES/DRESSINGS) ×3 IMPLANT
DURAPREP 26ML APPLICATOR (WOUND CARE) ×3 IMPLANT
ELECT REM PT RETURN 9FT ADLT (ELECTROSURGICAL) ×3
ELECTRODE REM PT RTRN 9FT ADLT (ELECTROSURGICAL) ×1 IMPLANT
GAUZE SPONGE 4X4 12PLY STRL (GAUZE/BANDAGES/DRESSINGS) ×3 IMPLANT
GLOVE BIOGEL PI IND STRL 9 (GLOVE) ×1 IMPLANT
GLOVE BIOGEL PI INDICATOR 9 (GLOVE) ×2
GLOVE SURG ORTHO 9.0 STRL STRW (GLOVE) ×3 IMPLANT
GOWN STRL REUS W/ TWL XL LVL3 (GOWN DISPOSABLE) ×2 IMPLANT
GOWN STRL REUS W/TWL XL LVL3 (GOWN DISPOSABLE) ×4
KIT BASIN OR (CUSTOM PROCEDURE TRAY) ×3 IMPLANT
KIT ROOM TURNOVER OR (KITS) ×3 IMPLANT
NS IRRIG 1000ML POUR BTL (IV SOLUTION) ×3 IMPLANT
PACK ORTHO EXTREMITY (CUSTOM PROCEDURE TRAY) ×3 IMPLANT
PAD ARMBOARD 7.5X6 YLW CONV (MISCELLANEOUS) ×6 IMPLANT
SPONGE GAUZE 4X4 12PLY STER LF (GAUZE/BANDAGES/DRESSINGS) ×3 IMPLANT
SPONGE LAP 18X18 X RAY DECT (DISPOSABLE) ×3 IMPLANT
STOCKINETTE IMPERVIOUS LG (DRAPES) IMPLANT
SUT ETHILON 2 0 PSLX (SUTURE) ×6 IMPLANT
TOWEL OR 17X24 6PK STRL BLUE (TOWEL DISPOSABLE) ×3 IMPLANT
TOWEL OR 17X26 10 PK STRL BLUE (TOWEL DISPOSABLE) ×3 IMPLANT
UNDERPAD 30X30 INCONTINENT (UNDERPADS AND DIAPERS) ×3 IMPLANT
WATER STERILE IRR 1000ML POUR (IV SOLUTION) ×3 IMPLANT

## 2014-10-18 NOTE — Progress Notes (Signed)
sm amt dng noted after going to [facility].  dsg reinforced

## 2014-10-18 NOTE — Progress Notes (Signed)
Orthopedic Tech Progress Note Patient Details:  Jerome Gomez 02-Mar-1956 [id number]  Ortho Devices Type of Ortho Device: Postop shoe/boot Ortho Device/Splint Location: LLE Ortho Device/Splint Interventions: Ordered, Application   Jennye MoccasinHughes, Besan Ketchem Craig 10/18/2014, 7:04 PM

## 2014-10-18 NOTE — H&P (Signed)
Tessie Ekehillip E Michelli is an 58 y.o. male.   Chief Complaint: Gangrene left fourth toe HPI: Patient has peripheral vascular disease status post angioplasty with cardiology with gangrene of the left foot fourth toe  Past Medical History  Diagnosis Date  . Gout   . New onset type 2 diabetes mellitus 10/14/2014  . Peripheral vascular disease     Past Surgical History  Procedure Laterality Date  . Bone cyst excision Left     leg    History reviewed. No pertinent family history. Social History:  reports that he has been smoking Cigarettes.  He has a 7.5 pack-year smoking history. He has never used smokeless tobacco. He reports that he does not drink alcohol or use illicit drugs.  Allergies: No Known Allergies  No prescriptions prior to admission    Results for orders placed or performed during the hospital encounter of 10/14/14 (from the past 48 hour(s))  Glucose, capillary     Status: Abnormal   Collection Time: 10/16/14  7:10 AM  Result Value Ref Range   Glucose-Capillary 126 (H) 70 - 99 mg/dL   Comment 1 Notify RN    Comment 2 Documented in Chart   Glucose, capillary     Status: Abnormal   Collection Time: 10/16/14 12:12 PM  Result Value Ref Range   Glucose-Capillary 144 (H) 70 - 99 mg/dL   No results found.  Review of Systems  All other systems reviewed and are negative.   There were no vitals taken for this visit. Physical Exam   On examination patient has dry gangrene of the left foot fourth toe Assessment/Plan Assessment: Dry gangrene left foot fourth toe.  Plan: We'll plan for amputation of the left foot fourth ray. Risks and benefits were discussed including need for additional surgery nonhealing of the wound.  Kendallyn Lippold V 10/18/2014, 6:55 AM

## 2014-10-18 NOTE — Anesthesia Procedure Notes (Signed)
Procedure Name: LMA Insertion Date/Time: 10/18/2014 6:12 PM Performed by: Angelica PouSMITH, Elexus Barman PIZZICARA Pre-anesthesia Checklist: Patient identified, Timeout performed, Emergency Drugs available, Suction available and Patient being monitored Patient Re-evaluated:Patient Re-evaluated prior to inductionOxygen Delivery Method: Circle system utilized Preoxygenation: Pre-oxygenation with 100% oxygen Intubation Type: IV induction Ventilation: Mask ventilation without difficulty LMA: LMA with gastric port inserted LMA Size: 5.0 Number of attempts: 1 Placement Confirmation: breath sounds checked- equal and bilateral and positive ETCO2 Tube secured with: Tape Dental Injury: Teeth and Oropharynx as per pre-operative assessment

## 2014-10-18 NOTE — Anesthesia Postprocedure Evaluation (Signed)
  Anesthesia Post-op Note  Patient: Jerome Gomez  Procedure(s) Performed: Procedure(s): 4th Ray Amputation (Left)  Patient Location: PACU  Anesthesia Type:General  Level of Consciousness: awake, alert  and oriented  Airway and Oxygen Therapy: Patient Spontanous Breathing and Patient connected to nasal cannula oxygen  Post-op Pain: none  Post-op Assessment: Post-op Vital signs reviewed, Patient's Cardiovascular Status Stable, Respiratory Function Stable, Patent Airway, No signs of Nausea or vomiting and Pain level controlled  Post-op Vital Signs: stable  Last Vitals:  Filed Vitals:   10/18/14 1945  BP:   Pulse:   Temp: 36.2 C  Resp:     Complications: No apparent anesthesia complications

## 2014-10-18 NOTE — Anesthesia Preprocedure Evaluation (Addendum)
Anesthesia Evaluation  Patient identified by MRN, date of birth, ID band Patient awake    Reviewed: Allergy & Precautions, H&P , NPO status , Patient's Chart, lab work & pertinent test results, reviewed documented beta blocker date and time   Airway Mallampati: II   Neck ROM: Full    Dental  (+) Edentulous Upper, Missing   Pulmonary Current Smoker,  breath sounds clear to auscultation        Cardiovascular + Peripheral Vascular Disease Rhythm:Regular  EKG OK   Neuro/Psych    GI/Hepatic   Endo/Other  diabetes, Poorly Controlled, Type 2  Renal/GU      Musculoskeletal   Abdominal (+)  Abdomen: soft.    Peds  Hematology   Anesthesia Other Findings   Reproductive/Obstetrics                            Anesthesia Physical Anesthesia Plan  ASA: III  Anesthesia Plan: General   Post-op Pain Management:    Induction: Intravenous  Airway Management Planned: LMA and Oral ETT  Additional Equipment:   Intra-op Plan:   Post-operative Plan: Extubation in OR  Informed Consent: I have reviewed the patients History and Physical, chart, labs and discussed the procedure including the risks, benefits and alternatives for the proposed anesthesia with the patient or authorized representative who has indicated his/her understanding and acceptance.     Plan Discussed with:   Anesthesia Plan Comments:         Anesthesia Quick Evaluation

## 2014-10-18 NOTE — Op Note (Signed)
10/18/2014  6:45 PM  PATIENT:  Jerome Gomez    PRE-OPERATIVE DIAGNOSIS:  Gangrene Left 4th Toe  POST-OPERATIVE DIAGNOSIS:  Same  PROCEDURE:  4th Ray Amputation  SURGEON:  Nadara MustardUDA,MARCUS V, MD  PHYSICIAN ASSISTANT:None ANESTHESIA:   General  PREOPERATIVE INDICATIONS:  Jerome Ekehillip E Daddario is a  58 y.o. male with a diagnosis of Gangrene Left 4th Toe who failed conservative measures and elected for surgical management.    The risks benefits and alternatives were discussed with the patient preoperatively including but not limited to the risks of infection, bleeding, nerve injury, cardiopulmonary complications, the need for revision surgery, among others, and the patient was willing to proceed.  OPERATIVE IMPLANTS: None  OPERATIVE FINDINGS: Minimal petechial bleeding patient also had ischemic skin changes on the heel pad.  OPERATIVE PROCEDURE: Patient is a 89 showed gentleman was brought to the operating room and underwent a general anesthetic. After adequate levels of anesthesia obtained patient's left lower extremity was prepped using DuraPrep draped into a sterile field. A racquet incision was made around the fourth toe and metatarsal the metatarsal was resected through the mid shaft region. The metatarsal and toe were resected in one block of tissue. The wound was irrigated with normal saline. Patient had minimal petechial bleeding. There is no abscess no gangrenous changes within the wound. The incision was closed using 2-0 nylon. There is no cellulitis no drainage. Sterile compressive dressing was applied. Patient was extubated taken to the PACU in stable condition. Plan for nonweightbearing on the left follow-up in the office in one week. Patient requires no prescriptions.

## 2014-10-18 NOTE — Transfer of Care (Signed)
Immediate Anesthesia Transfer of Care Note  Patient: Jerome Gomez  Procedure(s) Performed: Procedure(s): 4th Ray Amputation (Left)  Patient Location: PACU  Anesthesia Type:General  Level of Consciousness: awake, alert , oriented and patient cooperative  Airway & Oxygen Therapy: Patient Spontanous Breathing and Patient connected to nasal cannula oxygen  Post-op Assessment: Report given to PACU RN, Post -op Vital signs reviewed and stable and Patient moving all extremities  Post vital signs: Reviewed and stable  Complications: No apparent anesthesia complications

## 2014-10-22 ENCOUNTER — Encounter (HOSPITAL_COMMUNITY): Payer: Self-pay | Admitting: Orthopedic Surgery

## 2014-10-28 NOTE — Discharge Summary (Signed)
[facility] Discharge Summary   Patient ID:  Javohn E Hair MRN: [id number] DOB/AGE: 56/28/1957 56 y.o.  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 Gout   . New onset type 2 diabetes mellitus 10/14/2014  . Peripheral vascular disease       Discharged Condition: stable  HPI: Patient is a 58 y.o. year old male who presents for evaluation of left foot ischemia. Pt was moving some things around a few days ago and noticed a pulling sensation in his left calf. He noticed yesterday his left fourth toe had turned purple. He has some pain in the foot. He has no prior episodes and does not describe claudication. He is a 1.5 ppd smoker. Denies drugs or alcohol. Denies chest pain or shortness of breath. No prior cardiac or arrhythmia history. He denies hypertension diabetes or elevated cholesterol. Other medical problems include gout which is stable.    Hospital Course:  Tessie Ekehillip E Leiker is a 58 y.o. male who underwent CTA which was reviewed by Dr. 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  10/12/2014 Pt informed of risks of limb loss. He has refused stress test today. He has newly diagnosed diabetes. ABI left sever dampened mono phasic wave forms 0.27, right 0.74.  He states he cannot stay in hospital for further workup. Risk of limb loss and untreated diabetes explained to pt. He wishes to sign out against medical advice.  Pt explained importance to quit smoking. Says he is not ready at this time. Explained newly diagnosed diabetes and he said he would try to change his  diet.   Significant Diagnostic Studies: CBC Lab Results  Component Value Date   WBC 13.2* 10/15/2014   HGB 11.8* 10/15/2014   HCT 35.3* 10/15/2014   MCV 87.4 10/15/2014   PLT 357 10/15/2014    BMET    Component Value Date/Time   NA 140 10/16/2014 0255   K 3.8 10/16/2014 0255   CL 103 10/16/2014 0255   CO2 24 10/16/2014 0255   GLUCOSE 136* 10/16/2014 0255   BUN 8 10/16/2014 0255   CREATININE 0.66 10/16/2014 0255   CALCIUM 8.7 10/16/2014 0255   GFRNONAA >90 10/16/2014 0255   GFRAA >90 10/16/2014 0255   COAG Lab Results  Component Value Date   INR 1.17 10/14/2014     Disposition:  Discharge to :Home against medical advice.    Medication List    ASK your doctor about these medications        traMADol 50 MG tablet  Commonly known as:  ULTRAM  Take 100 mg by mouth every 6 (six) hours as needed for moderate pain.     VITAMIN C PO  Take 2 tablets by mouth once.       Verbal and written Discharge instructions given to the patient. Wound care per Discharge AVS   Signed: Clinton GallantCOLLINS, EMMA [facility] Rehabilitation[facility]EN 10/28/2014, 10:09 AM

## 2014-10-29 ENCOUNTER — Ambulatory Visit: Payer: Self-pay

## 2014-10-30 ENCOUNTER — Encounter (HOSPITAL_COMMUNITY): Payer: Self-pay | Admitting: *Deleted

## 2014-10-30 ENCOUNTER — Inpatient Hospital Stay (HOSPITAL_COMMUNITY)
Admission: EM | Admit: 2014-10-30 | Discharge: 2014-11-29 | DRG: 871 | Disposition: E | Payer: No Typology Code available for payment source | Attending: Internal Medicine | Admitting: Internal Medicine

## 2014-10-30 ENCOUNTER — Emergency Department (HOSPITAL_COMMUNITY)
Admission: EM | Admit: 2014-10-30 | Discharge: 2014-10-30 | Disposition: A | Discharge status: Home / Self Care | Payer: No Typology Code available for payment source | Attending: Emergency Medicine | Admitting: Emergency Medicine

## 2014-10-30 ENCOUNTER — Encounter (HOSPITAL_COMMUNITY): Payer: Self-pay | Admitting: Emergency Medicine

## 2014-10-30 DIAGNOSIS — I469 Cardiac arrest, cause unspecified: Secondary | ICD-10-CM | POA: Diagnosis not present

## 2014-10-30 DIAGNOSIS — J189 Pneumonia, unspecified organism: Secondary | ICD-10-CM | POA: Diagnosis not present

## 2014-10-30 DIAGNOSIS — D62 Acute posthemorrhagic anemia: Secondary | ICD-10-CM | POA: Diagnosis present

## 2014-10-30 DIAGNOSIS — R531 Weakness: Secondary | ICD-10-CM | POA: Diagnosis present

## 2014-10-30 DIAGNOSIS — R0602 Shortness of breath: Secondary | ICD-10-CM

## 2014-10-30 DIAGNOSIS — D500 Iron deficiency anemia secondary to blood loss (chronic): Secondary | ICD-10-CM | POA: Diagnosis present

## 2014-10-30 DIAGNOSIS — Y835 Amputation of limb(s) as the cause of abnormal reaction of the patient, or of later complication, without mention of misadventure at the time of the procedure: Secondary | ICD-10-CM | POA: Diagnosis present

## 2014-10-30 DIAGNOSIS — R55 Syncope and collapse: Secondary | ICD-10-CM | POA: Diagnosis present

## 2014-10-30 DIAGNOSIS — M96830 Postprocedural hemorrhage and hematoma of a musculoskeletal structure following a musculoskeletal system procedure: Secondary | ICD-10-CM | POA: Diagnosis not present

## 2014-10-30 DIAGNOSIS — Z79899 Other long term (current) drug therapy: Secondary | ICD-10-CM

## 2014-10-30 DIAGNOSIS — F17210 Nicotine dependence, cigarettes, uncomplicated: Secondary | ICD-10-CM | POA: Diagnosis present

## 2014-10-30 DIAGNOSIS — R42 Dizziness and giddiness: Secondary | ICD-10-CM | POA: Diagnosis not present

## 2014-10-30 DIAGNOSIS — Z978 Presence of other specified devices: Secondary | ICD-10-CM

## 2014-10-30 DIAGNOSIS — T814XXA Infection following a procedure, initial encounter: Secondary | ICD-10-CM | POA: Diagnosis present

## 2014-10-30 DIAGNOSIS — M109 Gout, unspecified: Secondary | ICD-10-CM | POA: Diagnosis not present

## 2014-10-30 DIAGNOSIS — Z89422 Acquired absence of other left toe(s): Secondary | ICD-10-CM

## 2014-10-30 DIAGNOSIS — L0390 Cellulitis, unspecified: Secondary | ICD-10-CM | POA: Diagnosis present

## 2014-10-30 DIAGNOSIS — I959 Hypotension, unspecified: Secondary | ICD-10-CM | POA: Diagnosis present

## 2014-10-30 DIAGNOSIS — E119 Type 2 diabetes mellitus without complications: Secondary | ICD-10-CM | POA: Diagnosis present

## 2014-10-30 DIAGNOSIS — Z7982 Long term (current) use of aspirin: Secondary | ICD-10-CM | POA: Insufficient documentation

## 2014-10-30 DIAGNOSIS — R6520 Severe sepsis without septic shock: Secondary | ICD-10-CM | POA: Diagnosis present

## 2014-10-30 DIAGNOSIS — E875 Hyperkalemia: Secondary | ICD-10-CM | POA: Diagnosis not present

## 2014-10-30 DIAGNOSIS — E872 Acidosis: Secondary | ICD-10-CM | POA: Diagnosis not present

## 2014-10-30 DIAGNOSIS — I509 Heart failure, unspecified: Secondary | ICD-10-CM

## 2014-10-30 DIAGNOSIS — Z720 Tobacco use: Secondary | ICD-10-CM | POA: Insufficient documentation

## 2014-10-30 DIAGNOSIS — T8781 Dehiscence of amputation stump: Secondary | ICD-10-CM | POA: Diagnosis present

## 2014-10-30 DIAGNOSIS — Z8679 Personal history of other diseases of the circulatory system: Secondary | ICD-10-CM | POA: Insufficient documentation

## 2014-10-30 DIAGNOSIS — M609 Myositis, unspecified: Secondary | ICD-10-CM | POA: Diagnosis present

## 2014-10-30 DIAGNOSIS — L02612 Cutaneous abscess of left foot: Secondary | ICD-10-CM | POA: Diagnosis present

## 2014-10-30 DIAGNOSIS — I214 Non-ST elevation (NSTEMI) myocardial infarction: Secondary | ICD-10-CM | POA: Diagnosis not present

## 2014-10-30 DIAGNOSIS — A419 Sepsis, unspecified organism: Principal | ICD-10-CM | POA: Diagnosis present

## 2014-10-30 DIAGNOSIS — L03818 Cellulitis of other sites: Secondary | ICD-10-CM

## 2014-10-30 DIAGNOSIS — Z7902 Long term (current) use of antithrombotics/antiplatelets: Secondary | ICD-10-CM | POA: Diagnosis not present

## 2014-10-30 DIAGNOSIS — L7621 Postprocedural hemorrhage and hematoma of skin and subcutaneous tissue following a dermatologic procedure: Secondary | ICD-10-CM | POA: Diagnosis present

## 2014-10-30 DIAGNOSIS — I249 Acute ischemic heart disease, unspecified: Secondary | ICD-10-CM | POA: Diagnosis present

## 2014-10-30 DIAGNOSIS — R58 Hemorrhage, not elsewhere classified: Secondary | ICD-10-CM

## 2014-10-30 DIAGNOSIS — I471 Supraventricular tachycardia: Secondary | ICD-10-CM | POA: Diagnosis not present

## 2014-10-30 DIAGNOSIS — T148XXA Other injury of unspecified body region, initial encounter: Secondary | ICD-10-CM

## 2014-10-30 DIAGNOSIS — I739 Peripheral vascular disease, unspecified: Secondary | ICD-10-CM | POA: Diagnosis present

## 2014-10-30 DIAGNOSIS — R6510 Systemic inflammatory response syndrome (SIRS) of non-infectious origin without acute organ dysfunction: Secondary | ICD-10-CM | POA: Diagnosis present

## 2014-10-30 LAB — COMPREHENSIVE METABOLIC PANEL
ALT: 17 U/L (ref 0–53)
ALT: 13 U/L (ref 0–53)
ANION GAP: 11 (ref 5–15)
AST: 15 U/L (ref 0–37)
AST: 12 U/L (ref 0–37)
Albumin: 3 g/dL — ABNORMAL LOW (ref 3.5–5.2)
Albumin: 2.6 g/dL — ABNORMAL LOW (ref 3.5–5.2)
Alkaline Phosphatase: 71 U/L (ref 39–117)
Alkaline Phosphatase: 57 U/L (ref 39–117)
Anion gap: 15 (ref 5–15)
BUN: 10 mg/dL (ref 6–23)
BUN: 7 mg/dL (ref 6–23)
CO2: 26 mEq/L (ref 19–32)
CO2: 20 meq/L (ref 19–32)
Calcium: 8.9 mg/dL (ref 8.4–10.5)
Calcium: 8.3 mg/dL — ABNORMAL LOW (ref 8.4–10.5)
Chloride: 102 mEq/L (ref 96–112)
Chloride: 102 mEq/L (ref 96–112)
Creatinine, Ser: 0.75 mg/dL (ref 0.50–1.35)
Creatinine, Ser: 0.57 mg/dL (ref 0.50–1.35)
GFR calc non Af Amer: >90 mL/min (ref >90)
GLUCOSE: 184 mg/dL — AB (ref 70–99)
GLUCOSE: 189 mg/dL — AB (ref 70–99)
POTASSIUM: 4.1 meq/L (ref 3.7–5.3)
Potassium: 5.1 mEq/L (ref 3.7–5.3)
SODIUM: 139 meq/L (ref 137–147)
SODIUM: 137 meq/L (ref 137–147)
TOTAL PROTEIN: 7.1 g/dL (ref 6.0–8.3)
Total Bilirubin: 0.4 mg/dL (ref 0.3–1.2)
Total Bilirubin: 0.3 mg/dL (ref 0.3–1.2)
Total Protein: 6.1 g/dL (ref 6.0–8.3)

## 2014-10-30 LAB — CBC WITH DIFFERENTIAL/PLATELET
Basophils Absolute: 0.1 10*3/uL (ref 0.0–0.1)
Basophils Absolute: 0.2 10*3/uL — ABNORMAL HIGH (ref 0.0–0.1)
Basophils Relative: 1 % (ref 0–1)
Basophils Relative: 1 % (ref 0–1)
EOS ABS: 0.2 10*3/uL (ref 0.0–0.7)
EOS PCT: 1 % (ref 0–5)
EOS PCT: 1 % (ref 0–5)
Eosinophils Absolute: 0.2 10*3/uL (ref 0.0–0.7)
HCT: 34.5 % — ABNORMAL LOW (ref 39.0–52.0)
HCT: 29.5 % — ABNORMAL LOW (ref 39.0–52.0)
HEMOGLOBIN: 10.2 g/dL — AB (ref 13.0–17.0)
Hemoglobin: 11.3 g/dL — ABNORMAL LOW (ref 13.0–17.0)
LYMPHS ABS: 1.5 10*3/uL (ref 0.7–4.0)
LYMPHS PCT: 18 % (ref 12–46)
Lymphocytes Relative: 8 % — ABNORMAL LOW (ref 12–46)
Lymphs Abs: 3 10*3/uL (ref 0.7–4.0)
MCH: 28.6 pg (ref 26.0–34.0)
MCH: 31 pg (ref 26.0–34.0)
MCHC: 32.8 g/dL (ref 30.0–36.0)
MCHC: 34.6 g/dL (ref 30.0–36.0)
MCV: 87.3 fL (ref 78.0–100.0)
MCV: 89.7 fL (ref 78.0–100.0)
Monocytes Absolute: 1.3 10*3/uL — ABNORMAL HIGH (ref 0.1–1.0)
Monocytes Absolute: 1 10*3/uL (ref 0.1–1.0)
Monocytes Relative: 7 % (ref 3–12)
Monocytes Relative: 6 % (ref 3–12)
NEUTROS PCT: 74 % (ref 43–77)
Neutro Abs: 15.1 10*3/uL — ABNORMAL HIGH (ref 1.7–7.7)
Neutro Abs: 12.5 10*3/uL — ABNORMAL HIGH (ref 1.7–7.7)
Neutrophils Relative %: 83 % — ABNORMAL HIGH (ref 43–77)
PLATELETS: 496 10*3/uL — AB (ref 150–400)
Platelets: 483 10*3/uL — ABNORMAL HIGH (ref 150–400)
RBC: 3.95 MIL/uL — AB (ref 4.22–5.81)
RBC: 3.29 MIL/uL — ABNORMAL LOW (ref 4.22–5.81)
RDW: 12.8 % (ref 11.5–15.5)
RDW: 12.9 % (ref 11.5–15.5)
WBC: 18.2 10*3/uL — ABNORMAL HIGH (ref 4.0–10.5)
WBC: 16.9 10*3/uL — ABNORMAL HIGH (ref 4.0–10.5)

## 2014-10-30 LAB — CBG MONITORING, ED: Glucose-Capillary: 158 mg/dL — ABNORMAL HIGH (ref 70–99)

## 2014-10-30 LAB — PROTIME-INR
INR: 1.23 (ref 0.00–1.49)
PROTHROMBIN TIME: 15.7 s — AB (ref 11.6–15.2)

## 2014-10-30 LAB — SAMPLE TO BLOOD BANK

## 2014-10-30 LAB — APTT: aPTT: 32 seconds (ref 24–37)

## 2014-10-30 MED ORDER — PIPERACILLIN-TAZOBACTAM 3.375 G IVPB 30 MIN
3.3750 g | Freq: Once | INTRAVENOUS | Status: AC
  Administered 2014-10-30: 3.375 g via INTRAVENOUS
  Filled 2014-10-30: qty 50

## 2014-10-30 MED ORDER — SODIUM CHLORIDE 0.9 % IV BOLUS (SEPSIS)
1000.0000 mL | Freq: Once | INTRAVENOUS | Status: AC
  Administered 2014-10-30: 1000 mL via INTRAVENOUS

## 2014-10-30 MED ORDER — SODIUM CHLORIDE 0.9 % IV BOLUS (SEPSIS)
2000.0000 mL | Freq: Once | INTRAVENOUS | Status: AC
  Administered 2014-10-30: 2000 mL via INTRAVENOUS

## 2014-10-30 MED ORDER — PIPERACILLIN-TAZOBACTAM 3.375 G IVPB
3.3750 g | Freq: Three times a day (TID) | INTRAVENOUS | Status: DC
  Administered 2014-10-31 (×3): 3.375 g via INTRAVENOUS
  Filled 2014-10-30 (×7): qty 50

## 2014-10-30 MED ORDER — DEXTROSE 5 % IV SOLN
1.0000 g | Freq: Once | INTRAVENOUS | Status: AC
  Administered 2014-10-30: 1 g via INTRAVENOUS
  Filled 2014-10-30: qty 10

## 2014-10-30 MED ORDER — VANCOMYCIN HCL IN DEXTROSE 1-5 GM/200ML-% IV SOLN
1000.0000 mg | Freq: Three times a day (TID) | INTRAVENOUS | Status: DC
  Administered 2014-10-30 – 2014-10-31 (×2): 1000 mg via INTRAVENOUS
  Filled 2014-10-30 (×3): qty 200

## 2014-10-30 NOTE — ED Notes (Signed)
Gave pt Sprite with MD Zammit approval.

## 2014-10-30 NOTE — ED Provider Notes (Signed)
6:11 PM  Assumed care from Dr. Estell HarpinZammit.  Pt is a 58 y.o. M with recent right foot surgery with Dr. Lajoyce Cornersuda who presents to the emergency department from the orthopedics office with a syncopal episode. Patient has been walking on the foot despite being told to be nonweightbearing. He opened the wound up today and it was bleeding. Bleeding has stopped and his hemoglobin is 11.3. He was orthostatic here in the ED. He has received 6 L of IV fluids total and his blood pressure is now 99 systolic with standing and he is asymptomatic. He has been offered admission which he refuses stating he wants to go home and he is feeling much better. He does have an elevated white count of 18 but no fever. No obvious sign of infection of the foot. He did however receive 1 g of ceftriaxone in the ED. He has follow-up with his orthopedist tomorrow.  Discussed return precautions and importance of drinking clear fluids. Patient verbalizes understanding and is comfortable with this plan.  Layla MawKristen N Ryder Chesmore, DO 11/20/2014 [id number]

## 2014-10-30 NOTE — ED Notes (Signed)
Antony MaduraKelly Humes, PA at bedside.

## 2014-10-30 NOTE — ED Provider Notes (Signed)
CSN: [id number]     Arrival date & time 11/28/2014  2148 History   First MD Initiated Contact with Patient 11/14/2014 2217     Chief Complaint  Patient presents with  . Wound Check    (Consider location/radiation/quality/duration/timing/severity/associated sxs/prior Treatment) HPI Comments: Patient is a 58 y/o male with a hx of DM and PVD. 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 Lajoyce Corners on 09/2014. He is on chronic Plavix and presents to the ED today for worsening bleeding from his surgical site. Patient seen and evaluated earlier today for syncopal episode/lightheadedness/and generalized weakness which occurred prior to his f/u with Dr. Lajoyce Corners. He was found to be hypotensive and required hydration with 6L IVF. He remained orthostatic prior to discharge, but stated he was feeling better and did not want to be admitted. He presents now, 4 hours later, and states that he is unable to get the wound to stop bleeding. Wife states he has soaked through one dressing in 1 hour and that symptoms are worse when upright and attempting to ambulate or transition. Wife states she has noticed some redness around the wound. No red streaking. He states he did not take his Plavix today, but did take this medication yesterday. He denies any fever, new syncope or near syncope, or numbness/weakness. No CP or SOB.  PCP - none  Patient is a 58 y.o. male presenting with wound check. The history is provided by the patient and the spouse. No language interpreter was used.  Wound Check Associated symptoms include myalgias (mild). Pertinent negatives include no fever, numbness or weakness.    Past Medical History  Diagnosis Date  . Gout   . New onset type 2 diabetes mellitus 10/14/2014  . Peripheral vascular disease    Past Surgical History  Procedure Laterality Date  . Bone cyst excision Left     leg  . Amputation Left 10/18/2014    Procedure: 4th Ray Amputation;  Surgeon: Nadara Mustard, MD;  Location: [facility] OR;  Service:  Orthopedics;  Laterality: Left;   No family history on file. History  Substance Use Topics  . Smoking status: Current Every Day Smoker -- 0.25 packs/day for 30 years    Types: Cigarettes  . Smokeless tobacco: Never Used  . Alcohol Use: No    Review of Systems  Constitutional: Negative for fever.  Musculoskeletal: Positive for myalgias (mild).  Skin: Positive for color change and wound.  Neurological: Negative for syncope, weakness and numbness.  All other systems reviewed and are negative.   Allergies  Review of patient's allergies indicates no known allergies.  Home Medications   Prior to Admission medications   Medication Sig Start Date End Date Taking? Authorizing Provider  aspirin EC 81 MG EC tablet Take 1 tablet (81 mg total) by mouth daily. 10/16/14  Yes Pamella Pert, MD  atorvastatin (LIPITOR) 80 MG tablet Take 1 tablet (80 mg total) by mouth daily at 6 PM. 10/16/14  Yes Pamella Pert, MD  clopidogrel (PLAVIX) 75 MG tablet Take 1 tablet (75 mg total) by mouth daily. 10/16/14  Yes Pamella Pert, MD  metFORMIN (GLUCOPHAGE) 500 MG tablet Take 1 tablet (500 mg total) by mouth 2 (two) times daily with a meal. 10/16/14  Yes Pamella Pert, MD  Oil of Oregano 1500 MG CAPS Take 2 capsules by mouth daily as needed (for infection).   Yes Historical Provider, MD  oxyCODONE-acetaminophen (PERCOCET) 10-325 MG per tablet Take 1 tablet by mouth every 4 (  four) hours as needed for pain. 10/16/14  Yes Pamella PertJagadeesh R Ganji, MD  traMADol (ULTRAM) 50 MG tablet Take 50 mg by mouth every 6 (six) hours as needed for moderate pain.   Yes Historical Provider, MD  vitamin C (ASCORBIC ACID) 500 MG tablet Take 500 mg by mouth daily.   Yes Historical Provider, MD  ampicillin (PRINCIPEN) 500 MG capsule Take 1 capsule (500 mg total) by mouth 4 (four) times daily. Patient not taking: Reported on 11/23/2014 10/16/14   Pamella PertJagadeesh R Ganji, MD  aspirin EC 325 MG tablet Take 1 tablet (325 mg total)  by mouth daily. 10/18/14   Nadara MustardMarcus Duda V, MD  living well with diabetes book MISC 1 each by Does not apply route once. 10/16/14   Pamella PertJagadeesh R Ganji, MD   BP 99/58 mmHg  Pulse 92  Temp(Src) 98 F (36.7 C) (Oral)  Resp 14  Ht 6' (1.829 m)  Wt 200 lb (90.719 kg)  BMI 27.12 kg/m2  SpO2 100%   Physical Exam  Constitutional: He is oriented to person, place, and time. He appears well-developed and well-nourished. No distress.  HENT:  Head: Normocephalic and atraumatic.  Eyes: Conjunctivae and EOM are normal. No scleral icterus.  Neck: Normal range of motion.  Cardiovascular: Normal rate, regular rhythm and intact distal pulses.   DP and PT pulses intact in LLE. Capillary refill <2 seconds in digits of LLE  Pulmonary/Chest: Effort normal. No respiratory distress.  Musculoskeletal: Normal range of motion.       Left foot: There is swelling (mild soft tissue swelling to L foot). There is no tenderness and no bony tenderness.       Feet:  Neurological: He is alert and oriented to person, place, and time. He exhibits normal muscle tone. Coordination normal.  Sensation to light touch intact.  Skin: Skin is warm and dry. No rash noted. He is not diaphoretic. There is erythema. No pallor.  There is erythema with mild warmth extending 2cm from the wound on the dorsal aspect of the L foot. No red streaking.  Psychiatric: He has a normal mood and affect. His behavior is normal.  Nursing note and vitals reviewed.   ED Course  Procedures (including critical care time) Labs Review Labs Reviewed  CBC WITH DIFFERENTIAL - Abnormal; Notable for the following:    WBC 16.9 (*)    RBC 3.29 (*)    Hemoglobin 10.2 (*)    HCT 29.5 (*)    Platelets 483 (*)    Neutro Abs 12.5 (*)    Basophils Absolute 0.2 (*)    All other components within normal limits  COMPREHENSIVE METABOLIC PANEL - Abnormal; Notable for the following:    Glucose, Bld 189 (*)    Calcium 8.3 (*)    Albumin 2.6 (*)    All other  components within normal limits  PROTIME-INR - Abnormal; Notable for the following:    Prothrombin Time 15.7 (*)    All other components within normal limits  CULTURE, BLOOD (ROUTINE X 2)  CULTURE, BLOOD (ROUTINE X 2)  APTT  SEDIMENTATION RATE  C-REACTIVE PROTEIN  I-STAT CG4 LACTIC ACID, ED  SAMPLE TO BLOOD BANK    Imaging Review No results found.   EKG Interpretation None      MDM   Final diagnoses:  Sepsis, due to unspecified organism  Bleeding from wound  Hypotension, unspecified hypotension type  Sepsis    Patient presents back to the emergency department for persistent bleeding from his  operative wound. Patient previously seen in the emergency department for syncope and was found to be hypotensive. He was hydrated with 6 L IV fluids and discharged with instruction to follow-up with Dr. Lajoyce Cornersuda. He states that his bleeding continued while at home. Patient was found to be hypotensive on arrival to 70/37. This improved with fluid hydration. Sepsis workup initiated and antibiotics started given persistent hypotension with tachycardia and leukocytosis. Will admit the patient for further monitoring and orthopedic consultation in AM. Dr. Toniann FailKakrakandy of [facility] to admit. Temp admit orders placed.   Filed Vitals:   11/02/2014 2300 10/31/2014 2315 11/02/2014 2318 11/12/2014 2330  BP: 105/56 105/57 105/57 99/58  Pulse: 90 92 92 92  Temp:      TempSrc:      Resp: 22 14 14 14   Height:      Weight:      SpO2: 99% 99% 99% 100%       Antony MaduraKelly Jadriel Saxer, PA-C 10/31/2014 2359  Rolland PorterMark James, MD 11/04/14 2329

## 2014-10-30 NOTE — ED Notes (Signed)
Pt ambulated to bathroom. Lt foot began bleeding through dressing. Bleeding controlled. MD Zammit notified. VSS.

## 2014-10-30 NOTE — ED Provider Notes (Signed)
CSN: [id number]     Arrival date & time 11/15/2014  1219 History   First MD Initiated Contact with Patient 11/20/2014 1316     Chief Complaint  Patient presents with  . Post-op Problem  . Near Syncope     (Consider location/radiation/quality/duration/timing/severity/associated sxs/prior Treatment) Patient is a 58 y.o. male presenting with near-syncope. The history is provided by the patient (the pt states his wound opened up and he bleed alot and passed out at the ortho office.).  Near Syncope This is a new problem. The current episode started 1 to 2 hours ago. The problem occurs rarely. The problem has been resolved. Pertinent negatives include no chest pain, no abdominal pain and no headaches. Nothing aggravates the symptoms. Nothing relieves the symptoms.    Past Medical History  Diagnosis Date  . Gout   . New onset type 2 diabetes mellitus 10/14/2014  . Peripheral vascular disease    Past Surgical History  Procedure Laterality Date  . Bone cyst excision Left     leg  . Amputation Left 10/18/2014    Procedure: 4th Ray Amputation;  Surgeon: Nadara MustardMarcus Duda V, MD;  [facility];  Service: Orthopedics;  Laterality: Left;   No family history on file. History  Substance Use Topics  . Smoking status: Current Every Day Smoker -- 0.25 packs/day for 30 years    Types: Cigarettes  . Smokeless tobacco: Never Used  . Alcohol Use: No    Review of Systems  Constitutional: Negative for appetite change and fatigue.  HENT: Negative for congestion, ear discharge and sinus pressure.   Eyes: Negative for discharge.  Respiratory: Negative for cough.   Cardiovascular: Positive for near-syncope. Negative for chest pain.  Gastrointestinal: Negative for abdominal pain and diarrhea.  Genitourinary: Negative for frequency and hematuria.  Musculoskeletal: Negative for back pain.  Skin: Negative for rash.  Neurological: Negative for seizures and headaches.  Psychiatric/Behavioral: Negative for  hallucinations.      Allergies  Review of patient's allergies indicates no known allergies.  Home Medications   Prior to Admission medications   Medication Sig Start Date End Date Taking? Authorizing Provider  aspirin EC 325 MG tablet Take 1 tablet (325 mg total) by mouth daily. 10/18/14  Yes Nadara MustardMarcus Duda V, MD  aspirin EC 81 MG EC tablet Take 1 tablet (81 mg total) by mouth daily. 10/16/14  Yes Pamella PertJagadeesh R Ganji, MD  atorvastatin (LIPITOR) 80 MG tablet Take 1 tablet (80 mg total) by mouth daily at 6 PM. 10/16/14  Yes Pamella PertJagadeesh R Ganji, MD  clopidogrel (PLAVIX) 75 MG tablet Take 1 tablet (75 mg total) by mouth daily. 10/16/14  Yes Pamella PertJagadeesh R Ganji, MD  metFORMIN (GLUCOPHAGE) 500 MG tablet Take 1 tablet (500 mg total) by mouth 2 (two) times daily with a meal. 10/16/14  Yes Pamella PertJagadeesh R Ganji, MD  ampicillin (PRINCIPEN) 500 MG capsule Take 1 capsule (500 mg total) by mouth 4 (four) times daily. Patient not taking: Reported on 11/08/2014 10/16/14   Pamella PertJagadeesh R Ganji, MD  living well with diabetes book MISC 1 each by Does not apply route once. 10/16/14   Pamella PertJagadeesh R Ganji, MD  oxyCODONE-acetaminophen (PERCOCET) 10-325 MG per tablet Take 1 tablet by mouth every 4 (four) hours as needed for pain. Patient not taking: Reported on 11/08/2014 10/16/14   Pamella PertJagadeesh R Ganji, MD   BP 121/70 mmHg  Pulse 86  Temp(Src) 97.8 F (36.6 C) (Oral)  Resp 11  SpO2 100% Physical Exam  Constitutional: He is  oriented to person, place, and time. He appears well-developed.  HENT:  Head: Normocephalic.  Eyes: Conjunctivae and EOM are normal. No scleral icterus.  Neck: Neck supple. No thyromegaly present.  Cardiovascular: Normal rate and regular rhythm.  Exam reveals no gallop and no friction rub.   No murmur heard. Pulmonary/Chest: No stridor. He has no wheezes. He has no rales. He exhibits no tenderness.  Abdominal: He exhibits no distension. There is no tenderness. There is no rebound.  Musculoskeletal:  Normal range of motion. He exhibits no edema.  Pt has a gaping wound from surgery to his left foot.  It is 1.5 cm in diameter.   Large clot to foot with no obvious infection  Lymphadenopathy:    He has no cervical adenopathy.  Neurological: He is oriented to person, place, and time. He exhibits normal muscle tone. Coordination normal.  Skin: No rash noted. No erythema.  Psychiatric: He has a normal mood and affect. His behavior is normal.    ED Course  Procedures (including critical care time) Labs Review Labs Reviewed  CBC WITH DIFFERENTIAL - Abnormal; Notable for the following:    WBC 18.2 (*)    RBC 3.95 (*)    Hemoglobin 11.3 (*)    HCT 34.5 (*)    Platelets 496 (*)    Neutrophils Relative % 83 (*)    Neutro Abs 15.1 (*)    Lymphocytes Relative 8 (*)    Monocytes Absolute 1.3 (*)    All other components within normal limits  COMPREHENSIVE METABOLIC PANEL - Abnormal; Notable for the following:    Glucose, Bld 184 (*)    Albumin 3.0 (*)    All other components within normal limits  CBG MONITORING, ED - Abnormal; Notable for the following:    Glucose-Capillary 158 (*)    All other components within normal limits    Imaging Review No results found.   EKG Interpretation None    I spoke with dr. Lajoyce Cornersduda and he stated he will do out pt surgery Friday.  The pt has an appointment with  duda tomorrow.     The pt has improved with 3.5 liters of fluids,  But is still some orthostatic.  He states he was not dizzy and wanted to go home.   He did not want to be admitted.   He will get another 2 liters and recheck  MDM   Final diagnoses:  None   Bleeding from surgical wound  Benny LennertJoseph L Guthrie Lemme, MD 01/30/2014 1651

## 2014-10-30 NOTE — ED Notes (Addendum)
Pt from home brought in by [facility], c/o of bleeding from surgical incision to LT foot. Pt had his LT 4th toe removed about 10 days ago. Pt states it has been "oozing ever since the surgery, but when I woke up this morning around 1000, I walked downstairs and noticed there was spots of blood on the floor." He states that then he noticed his slipper was full of blood. At 1130, [facility] because they stated his BP was in the 70s and pt had a near syncopal episode. Per EMS, pt's BP was 124/102 upon arrival. Pt given 700 cc NS. Pt c/o of lightheadedness and nausea in triage. Denies pain. Bleeding controlled. NAD noted. VSS.

## 2014-10-30 NOTE — ED Notes (Signed)
The pt had surgery on his lt greatr toe one week ago.  Today the wound has been bleeding and he was seen at dr dudas office and he wass also seen here.  The wound keeps bleeding and will niot stop

## 2014-10-30 NOTE — ED Notes (Signed)
PT monitored by pulse ox, bp cuff, and 5-lead. 

## 2014-10-30 NOTE — Discharge Instructions (Signed)
Follow up with dr. Lajoyce Cornersuda tomorrow.  Do Not put any weight on your left foot.  Return if you develop fever, pain, bleeding that will not stop, feel like he may pass out or do pass out, chest pain or shortness of breath.   Orthostatic Hypotension Orthostatic hypotension is a sudden drop in blood pressure. It happens when you quickly stand up from a seated or lying position. You may feel dizzy or light-headed. This can last for just a few seconds or for up to a few minutes. It is usually not a serious problem. However, if this happens frequently or gets worse, it can be a sign of something more serious. CAUSES  Different things can cause orthostatic hypotension, including:   Loss of body fluids (dehydration).  Medicines that lower blood pressure.  Sudden changes in posture, such as standing up quickly after you have been sitting or lying down.  Taking too much of your medicine. SIGNS AND SYMPTOMS   Light-headedness or dizziness.   Fainting or near-fainting.   A fast heart rate.   Weakness.   Feeling tired (fatigue).  DIAGNOSIS  Your health care provider may do several things to help diagnose your condition and identify the cause. These may include:   Taking a medical history and doing a physical exam.  Checking your blood pressure. Your health care provider will check your blood pressure when you are:  Lying down.  Sitting.  Standing.  Using tilt table testing. In this test, you lie down on a table that moves from a lying position to a standing position. You will be strapped onto the table. This test monitors your blood pressure and heart rate when you are in different positions. TREATMENT  Treatment will vary depending on the cause. Possible treatments include:   Changing the dosage of your medicines.  Wearing compression stockings on your lower legs.  Standing up slowly after sitting or lying down.  Eating more salt.  Eating frequent, small meals.  In some  cases, getting IV fluids.  Taking medicine to enhance fluid retention. HOME CARE INSTRUCTIONS  Only take over-the-counter or prescription medicines as directed by your health care provider.  Follow your health care provider's instructions for changing the dosage of your current medicines.  Do not stop or adjust your medicine on your own.  Stand up slowly after sitting or lying down. This allows your body to adjust to the different position.  Wear compression stockings as directed.  Eat extra salt as directed.  Do not add extra salt to your diet unless directed to by your health care provider.  Eat frequent, small meals.  Avoid standing suddenly after eating.  Avoid hot showers or excessive heat as directed by your health care provider.  Keep all follow-up appointments. SEEK MEDICAL CARE IF:  You continue to feel dizzy or light-headed after standing.  You feel groggy or confused.  You feel cold, clammy, or sick to your stomach (nauseous).  You have blurred vision.  You feel short of breath. SEEK IMMEDIATE MEDICAL CARE IF:   You faint after standing.  You have chest pain.  You have difficulty breathing.   You lose feeling or movement in your arms or legs.   You have slurred speech or difficulty talking, or you are unable to talk.  MAKE SURE YOU:   Understand these instructions.  Will watch your condition.  Will get help right away if you are not doing well or get worse. Document Released: 11/05/2002 Document Revised: 11/20/2013  Document Reviewed: 09/07/2013 [facility] Patient Information 2015 [place]. This information is not intended to replace advice given to you by your health care provider. Make sure you discuss any questions you have with your health care provider.

## 2014-10-30 NOTE — Progress Notes (Signed)
ANTIBIOTIC CONSULT NOTE - INITIAL  Pharmacy Consult for vancomycin and zosyn  Indication: sepsis    No Known Allergies  Patient Measurements: Height: 6' (182.9 cm) Weight: 200 lb (90.719 kg) IBW/kg (Calculated) : 77.6 Adjusted Body Weight:   Vital Signs: Temp: 98 F (36.7 C) (12/02 2235) Temp Source: Oral (12/02 2235) BP: 103/50 mmHg (12/02 2230) Pulse Rate: 100 (12/02 2230) Intake/Output from previous day:   Intake/Output from this shift:    Labs:  Recent Labs  11/27/2014 1340 11/27/2014 2204  WBC 18.2* 16.9*  HGB 11.3* 10.2*  PLT 496* 483*  CREATININE 0.75  --    Estimated Creatinine Clearance: 113.2 mL/min (by C-G formula based on Cr of 0.75). No results for input(s): VANCOTROUGH, VANCOPEAK, VANCORANDOM, GENTTROUGH, GENTPEAK, GENTRANDOM, TOBRATROUGH, TOBRAPEAK, TOBRARND, AMIKACINPEAK, AMIKACINTROU, AMIKACIN in the last 72 hours.   Microbiology: Recent Results (from the past 720 hour(s))  Wound culture     Status: None   Collection Time: 10/11/14  1:05 PM  Result Value Ref Range Status   Specimen Description TOE LT KNEE  Final   Special Requests NONE  Final   Gram Stain   Final    RARE WBC PRESENT, PREDOMINANTLY MONONUCLEAR NO SQUAMOUS EPITHELIAL CELLS SEEN FEW GRAM POSITIVE COCCI IN PAIRS IN CLUSTERS RARE GRAM NEGATIVE RODS Performed at [facility]    Culture   Final    MODERATE GROUP B STREP(S.AGALACTIAE)ISOLATED Note: TESTING AGAINST S. AGALACTIAE NOT ROUTINELY PERFORMED DUE TO PREDICTABILITY OF AMP/PEN/VAN SUSCEPTIBILITY. Performed at [facility]    Report Status 10/13/2014 FINAL  Final    Medical History: Past Medical History  Diagnosis Date  . Gout   . New onset type 2 diabetes mellitus 10/14/2014  . Peripheral vascular disease     Medications:   (Not in a hospital admission) Assessment: Pt with toe amputation 10 days ago with wound bleeding profusely. Near syncopal episode in MD office. Now vanc and zosyn for sepsis  coverage. Received rocephin earlier in ED  Goal of Therapy:  Vancomycin 15-20 mcg/ml   Plan:  Vancomycin  1gm q8h  Zosyn 3.375gm q8h  Jerome CoffinEarl, Jerome Gomez 11/08/2014,11:17 PM

## 2014-10-31 ENCOUNTER — Inpatient Hospital Stay (HOSPITAL_COMMUNITY): Payer: No Typology Code available for payment source

## 2014-10-31 ENCOUNTER — Other Ambulatory Visit (HOSPITAL_COMMUNITY): Payer: Self-pay | Admitting: Orthopedic Surgery

## 2014-10-31 ENCOUNTER — Encounter (HOSPITAL_COMMUNITY): Payer: Self-pay | Admitting: Internal Medicine

## 2014-10-31 DIAGNOSIS — R6510 Systemic inflammatory response syndrome (SIRS) of non-infectious origin without acute organ dysfunction: Secondary | ICD-10-CM | POA: Diagnosis present

## 2014-10-31 DIAGNOSIS — A419 Sepsis, unspecified organism: Principal | ICD-10-CM

## 2014-10-31 DIAGNOSIS — Z720 Tobacco use: Secondary | ICD-10-CM | POA: Diagnosis present

## 2014-10-31 DIAGNOSIS — D500 Iron deficiency anemia secondary to blood loss (chronic): Secondary | ICD-10-CM

## 2014-10-31 DIAGNOSIS — T148XXA Other injury of unspecified body region, initial encounter: Secondary | ICD-10-CM | POA: Insufficient documentation

## 2014-10-31 DIAGNOSIS — T792XXA Traumatic secondary and recurrent hemorrhage and seroma, initial encounter: Secondary | ICD-10-CM

## 2014-10-31 DIAGNOSIS — E119 Type 2 diabetes mellitus without complications: Secondary | ICD-10-CM

## 2014-10-31 LAB — COMPREHENSIVE METABOLIC PANEL
ALBUMIN: 2.4 g/dL — AB (ref 3.5–5.2)
ALT: 11 U/L (ref 0–53)
ANION GAP: 10 (ref 5–15)
AST: 10 U/L (ref 0–37)
Alkaline Phosphatase: 52 U/L (ref 39–117)
BUN: 7 mg/dL (ref 6–23)
CALCIUM: 8.1 mg/dL — AB (ref 8.4–10.5)
CO2: 24 mEq/L (ref 19–32)
Chloride: 107 mEq/L (ref 96–112)
Creatinine, Ser: 0.7 mg/dL (ref 0.50–1.35)
GFR calc Af Amer: >90 mL/min (ref >90)
GFR calc non Af Amer: >90 mL/min (ref >90)
Glucose, Bld: 145 mg/dL — ABNORMAL HIGH (ref 70–99)
POTASSIUM: 4.5 meq/L (ref 3.7–5.3)
SODIUM: 141 meq/L (ref 137–147)
TOTAL PROTEIN: 5.5 g/dL — AB (ref 6.0–8.3)
Total Bilirubin: <0.2 mg/dL — ABNORMAL LOW (ref 0.3–1.2)

## 2014-10-31 LAB — CBC WITH DIFFERENTIAL/PLATELET
Basophils Absolute: 0.1 10*3/uL (ref 0.0–0.1)
Basophils Relative: 1 % (ref 0–1)
Eosinophils Absolute: 0.3 10*3/uL (ref 0.0–0.7)
Eosinophils Relative: 2 % (ref 0–5)
HEMATOCRIT: 25 % — AB (ref 39.0–52.0)
HEMOGLOBIN: 8.3 g/dL — AB (ref 13.0–17.0)
Lymphocytes Relative: 23 % (ref 12–46)
Lymphs Abs: 3.1 10*3/uL (ref 0.7–4.0)
MCH: 30 pg (ref 26.0–34.0)
MCHC: 33.2 g/dL (ref 30.0–36.0)
MCV: 90.3 fL (ref 78.0–100.0)
MONOS PCT: 8 % (ref 3–12)
Monocytes Absolute: 1.1 10*3/uL — ABNORMAL HIGH (ref 0.1–1.0)
NEUTROS PCT: 66 % (ref 43–77)
Neutro Abs: 8.9 10*3/uL — ABNORMAL HIGH (ref 1.7–7.7)
Platelets: 433 10*3/uL — ABNORMAL HIGH (ref 150–400)
RBC: 2.77 MIL/uL — ABNORMAL LOW (ref 4.22–5.81)
RDW: 13 % (ref 11.5–15.5)
WBC: 13.4 10*3/uL — ABNORMAL HIGH (ref 4.0–10.5)

## 2014-10-31 LAB — SEDIMENTATION RATE: Sed Rate: 55 mm/hr — ABNORMAL HIGH (ref 0–16)

## 2014-10-31 LAB — GLUCOSE, CAPILLARY
GLUCOSE-CAPILLARY: 147 mg/dL — AB (ref 70–99)
GLUCOSE-CAPILLARY: 171 mg/dL — AB (ref 70–99)
Glucose-Capillary: 119 mg/dL — ABNORMAL HIGH (ref 70–99)
Glucose-Capillary: 116 mg/dL — ABNORMAL HIGH (ref 70–99)

## 2014-10-31 LAB — C-REACTIVE PROTEIN: CRP: 6.3 mg/dL — ABNORMAL HIGH (ref <0.60)

## 2014-10-31 LAB — I-STAT CG4 LACTIC ACID, ED: Lactic Acid, Venous: 1.55 mmol/L (ref 0.5–2.2)

## 2014-10-31 MED ORDER — ACETAMINOPHEN 650 MG RE SUPP: 650.0000 mg | Freq: Four times a day (QID) | RECTAL | Status: DC | PRN

## 2014-10-31 MED ORDER — ONDANSETRON HCL 4 MG/2ML IJ SOLN: 4.0000 mg | Freq: Four times a day (QID) | INTRAMUSCULAR | Status: DC | PRN

## 2014-10-31 MED ORDER — LORAZEPAM 1 MG PO TABS
1.0000 mg | ORAL_TABLET | Freq: Once | ORAL | Status: AC
  Administered 2014-11-01: 1 mg via ORAL
  Filled 2014-10-31: qty 1

## 2014-10-31 MED ORDER — ASPIRIN EC 81 MG PO TBEC
81.0000 mg | DELAYED_RELEASE_TABLET | Freq: Every day | ORAL | Status: DC
  Administered 2014-10-31: 81 mg via ORAL
  Filled 2014-10-31: qty 1

## 2014-10-31 MED ORDER — LIVING WELL WITH DIABETES BOOK
Freq: Once | Status: AC
  Administered 2014-11-01
  Filled 2014-10-31: qty 1

## 2014-10-31 MED ORDER — OXYCODONE HCL 5 MG PO TABS: 5.0000 mg | ORAL_TABLET | ORAL | Status: DC | PRN

## 2014-10-31 MED ORDER — VANCOMYCIN HCL 10 G IV SOLR
1250.0000 mg | Freq: Two times a day (BID) | INTRAVENOUS | Status: DC
  Administered 2014-10-31: 1250 mg via INTRAVENOUS
  Filled 2014-10-31 (×3): qty 1250

## 2014-10-31 MED ORDER — TRAMADOL HCL 50 MG PO TABS
50.0000 mg | ORAL_TABLET | Freq: Four times a day (QID) | ORAL | Status: DC | PRN
  Administered 2014-10-31 (×2): 50 mg via ORAL
  Filled 2014-10-31 (×2): qty 1

## 2014-10-31 MED ORDER — ACETAMINOPHEN 325 MG PO TABS
650.0000 mg | ORAL_TABLET | Freq: Four times a day (QID) | ORAL | Status: DC | PRN
  Administered 2014-10-31: 650 mg via ORAL
  Filled 2014-10-31: qty 2

## 2014-10-31 MED ORDER — SODIUM CHLORIDE 0.9 % IV SOLN
INTRAVENOUS | Status: DC
  Administered 2014-10-31: 02:00:00 via INTRAVENOUS

## 2014-10-31 MED ORDER — OXYCODONE-ACETAMINOPHEN 10-325 MG PO TABS: 1.0000 | ORAL_TABLET | ORAL | Status: DC | PRN

## 2014-10-31 MED ORDER — INSULIN ASPART 100 UNIT/ML ~~LOC~~ SOLN
0.0000 [IU] | Freq: Three times a day (TID) | SUBCUTANEOUS | Status: DC
  Administered 2014-10-31: 1 [IU] via SUBCUTANEOUS

## 2014-10-31 MED ORDER — SODIUM CHLORIDE 0.9 % IV BOLUS (SEPSIS)
500.0000 mL | Freq: Once | INTRAVENOUS | Status: AC
  Administered 2014-10-31: 500 mL via INTRAVENOUS

## 2014-10-31 MED ORDER — GADOBENATE DIMEGLUMINE 529 MG/ML IV SOLN
20.0000 mL | Freq: Once | INTRAVENOUS | Status: AC | PRN
  Administered 2014-10-31: 20 mL via INTRAVENOUS

## 2014-10-31 MED ORDER — CHLORHEXIDINE GLUCONATE 4 % EX LIQD
60.0000 mL | Freq: Once | CUTANEOUS | Status: DC
  Filled 2014-10-31: qty 60

## 2014-10-31 MED ORDER — CLOPIDOGREL BISULFATE 75 MG PO TABS
75.0000 mg | ORAL_TABLET | Freq: Every day | ORAL | Status: DC
  Filled 2014-10-31: qty 1

## 2014-10-31 MED ORDER — OXYCODONE-ACETAMINOPHEN 5-325 MG PO TABS: 1.0000 | ORAL_TABLET | ORAL | Status: DC | PRN

## 2014-10-31 MED ORDER — TRAMADOL-ACETAMINOPHEN 37.5-325 MG PO TABS: 1.0000 | ORAL_TABLET | Freq: Four times a day (QID) | ORAL | Status: DC | PRN

## 2014-10-31 MED ORDER — ATORVASTATIN CALCIUM 80 MG PO TABS
80.0000 mg | ORAL_TABLET | Freq: Every day | ORAL | Status: DC
Start: 2014-10-31 — End: 2014-11-01
  Administered 2014-10-31: 80 mg via ORAL
  Filled 2014-10-31 (×2): qty 1

## 2014-10-31 MED ORDER — ONDANSETRON HCL 4 MG PO TABS
4.0000 mg | ORAL_TABLET | Freq: Four times a day (QID) | ORAL | Status: DC | PRN
Start: 2014-10-31 — End: 2014-11-01

## 2014-10-31 MED ORDER — SODIUM CHLORIDE 0.9 % IJ SOLN
3.0000 mL | Freq: Two times a day (BID) | INTRAMUSCULAR | Status: DC
  Administered 2014-10-31: 3 mL via INTRAVENOUS

## 2014-10-31 NOTE — Plan of Care (Signed)
Problem: Phase I Progression Outcomes Goal: Pain controlled with appropriate interventions Outcome: Progressing Goal: OOB as tolerated unless otherwise ordered Outcome: Progressing Goal: Neurovascular status within defined limits Outcome: Progressing  Problem: Phase III Progression Outcomes Goal: Pain controlled on oral analgesia Outcome: Progressing Goal: Transfers with minimal assist Outcome: Progressing Goal: Completes ADLs with minimal assist Outcome: Progressing

## 2014-10-31 NOTE — Progress Notes (Signed)
Admitted from ED, alert and oriented x 4, with IVF ongoing to right forearm.  Patin\ent's left leg with dressing, noted to have dried drainage.  Oriented to plan of care and unit routines.  Patient advised on policy of sending home medicatins, patient declined to give home meds for safe keeping, instead verballized that wife will pick up by tomorrow.  Will monitor and evaluate at intervals.

## 2014-10-31 NOTE — Progress Notes (Signed)
Patient left foot wound assessed and dressing changed while MD present.  Wound is open with sutures present. Some bloody drainage noted along with foul smelling odor from wound.  Patient tolerated dressing change well. Patient also had home meds in his room which included tramadol. Meds removed from room along with cigarettes. Wallet left at the bedside. RN will continue to monitor wound. Bradley FerrisBrandie Jayma Volpi RN BSN 10/31/2014

## 2014-10-31 NOTE — Consult Note (Signed)
  Patient is approximately 2 weeks status post fourth ray amputation left foot. Patient called my office yesterday stating that he developed acute bleeding. He was seen in the office urgently yesterday and was hypotensive. Patient was sent by EMS to the emergency room and was hydrated. Patient's blood pressure improved he refused admission and was discharged back to home. Patient subsequently developed more bleeding and presented back to the emergency room and was admitted. Patient has been on chronic Plavix.  Patient does have an odor from the left foot. There is no active bleeding at this time. His hemoglobin is 10 was 11 yesterday.  Assessment open wound left foot fourth ray amputation with acute bleeding 2 weeks postoperatively.  Plan: We will plan for irrigation and debridement placement of antibiotic beads tomorrow Friday afternoon. Patient will need to be off his anticoagulation medicine. 

## 2014-10-31 NOTE — ED Notes (Signed)
Report attempted 

## 2014-10-31 NOTE — Progress Notes (Signed)
[facility] PROGRESS NOTE  Jerome Gomez ZOX:[id number] DOB: [birth year] DOA: 2014-11-27 PCP: No PCP Per Patient Brief narrative 58 year old male with  recently diagnosed diabetes mellitus, tobacco use disorder, hyperlipidemia, recently diagnosed peripheral artery disease status post stenting of his left SFA on 11/17 and placed on aspirin and Plavix with finding of 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 Patient initially came to the ED when he was found to be hypotensive and given 6 L of normal saline and discharged home. He presented back to the ED due to persistent bleeding and again was found to be hypotensive with blood work showing leukocytosis. Patient denied any pain to the surgical site and was afebrile. She however did feel weak and earlier during the day had all syncopal episode. With concern for sepsis he was started on empiric antibiotic and admitted to hospitalist service. Orthopedics Dr. Lajoyce Corners consulted.  Assessment/Plan: Sepsis secondary to infected left foot wound Patient on empiric vancomycin and Zosyn. Recent wound culture growing Streptococcus. He has recently completed a course of ampicillin. Blood cultures sent on admission. MRI of the foot done shows large open wound with complex abscess extending between the third and fifth metatarsals with surrounding myositis and cellulitis. No signs of osteomyelitis. -Supportive care with IV fluids and Tylenol -Dr. Lajoyce Corners plans on irrigation and debridement with placement of antibiotic beads on 12/4. -holding plavix for now given active bleeding form site and drop in H&H. We'll reassess after surgery and if no further bleeding will resume Plavix. Discussed plan with  his cardiologist Dr. Jacinto Halim .  Peripheral artery disease with recent left SFA stent Hold aspirin and Plavix. Monitor H&H  Acute blood loss anemia Likely secondary to  bleeding from his left foot. Holding aspirin and Plavix. Not need transfusion at this time.  Syncope Likely secondary to hypotension and sepsis. Monitor on telemetry. Continue with IV fluids  Type 2 diabetes mellitus Recently diagnosed with A1c of 8.1. Holding metformin. Monitor on sliding scale insulin  Ongoing tobacco use  Patient's counseled strongly on quitting. Reports smoking half a pack per day. Refuses nicotine patch.  Diet: Diabetic, nothing by mouth after midnight  Code Status: Full code Family Communication: None at bedside  Disposition Plan: Currently inpatient, will reevaluate after water tomorrow   Consultants:  Dr. Lajoyce Corners    Procedures:  For irrigation and debridement on 12/4  MRI left foot  Antibiotics:   vancomycin and Zosyn since 12/3  HPI/Subjective: Agent seen and examined after returning from MRI . Reports mild pain over the left foot.patient irritable on being told his mediation will be kept at the nursing station as the bag of medicine also had a pack of cigarettes. Nurse was cyst that patient might be smoking in his room.  Objective: Filed Vitals:   10/31/14 0422  BP: 102/62  Pulse: 98  Temp: 98.7 F (37.1 C)  Resp: 20    Intake/Output Summary (Last 24 hours) at 10/31/14 1300 Last data filed at 10/31/14 8119  Gross per 24 hour  Intake 646.67 ml  Output    450 ml  Net 196.67 ml   Filed Weights   11-27-14 2155 10/31/14 0113  Weight: 90.719 kg (200 lb) 90.946 kg (200 lb 8 oz)    Exam:   General:  Middle aged male in no acute distress  HEENT: Pallor present, moist oral mucosa  Chest: Clear to auscultation bilaterally  CVS:  Normal S1 and S2, no murmurs  Abdomen: Soft, nondistended, nontender  Extremities: Swollen left foot with foul order,dark blood over left 4th toe amputation site with widening of the sutures, dorsalis pedis palpable  CNS: Alert and oriented    Data Reviewed: Basic Metabolic Panel:  Recent Labs Lab  11/25/2014 1340 11/05/2014 2204 10/31/14 0319  NA 139 137 141  K 5.1 4.1 4.5  CL 102 102 107  CO2 26 20 24   GLUCOSE 184* 189* 145*  BUN 10 7 7   CREATININE 0.75 0.57 0.70  CALCIUM 8.9 8.3* 8.1*   Liver Function Tests:  Recent Labs Lab 11/14/2014 1340 11/23/2014 2204 10/31/14 0319  AST 15 12 10   ALT 17 13 11   ALKPHOS 71 57 52  BILITOT 0.4 0.3 <0.2*  PROT 7.1 6.1 5.5*  ALBUMIN 3.0* 2.6* 2.4*   No results for input(s): LIPASE, AMYLASE in the last 168 hours. No results for input(s): AMMONIA in the last 168 hours. CBC:  Recent Labs Lab 11/02/2014 1340 11/28/2014 2204 10/31/14 0319  WBC 18.2* 16.9* 13.4*  NEUTROABS 15.1* 12.5* 8.9*  HGB 11.3* 10.2* 8.3*  HCT 34.5* 29.5* 25.0*  MCV 87.3 89.7 90.3  PLT 496* 483* 433*   Cardiac Enzymes: No results for input(s): CKTOTAL, CKMB, CKMBINDEX, TROPONINI in the last 168 hours. BNP (last 3 results) No results for input(s): PROBNP in the last 8760 hours. CBG:  Recent Labs Lab 11/05/2014 1230 10/31/14 0617 10/31/14 1137  GLUCAP 158* 119* 147*    No results found for this or any previous visit (from the past 240 hour(s)).   Studies: Mr Foot Left W Wo Contrast  10/31/2014   CLINICAL DATA:  Status post amputation of the fourth metatarsal and toe with pain, swelling and bleeding from the surgical site.  EXAM: MRI OF THE LEFT FOREFOOT WITHOUT AND WITH CONTRAST  TECHNIQUE: Multiplanar, multisequence MR imaging was performed both before and after administration of intravenous contrast.  CONTRAST:  20mL MULTIHANCE GADOBENATE DIMEGLUMINE 529 MG/ML IV SOLN  COMPARISON:  Radiographs 10/31/2014  FINDINGS: There is a large open wound on the dorsum of the foot extending down into the surgical site between the third and fifth metatarsals. There is a extensive rim enhancing abscess measuring a maximum of 4.2 x 1.4 cm. I do not see any definite findings to suggest osteomyelitis involving the remaining base of the fourth metatarsal. The fat saturation is  poor and there is significant motion artifact but I do not see any obvious changes of osteomyelitis involving the third or fifth metatarsals or proximal phalanges.  IMPRESSION: Limited examination due to motion artifact and poor fat saturation.  No definite findings for osteomyelitis.  Large open wound with a complex abscess extending down between the third and fifth metatarsals. Surrounding myositis and cellulitis.   Electronically Signed   By: Loralie ChampagneMark  Gallerani M.D.   On: 10/31/2014 11:39   Dg Foot Complete Left  10/31/2014   CLINICAL DATA:  Amputation 1 week ago, persistent bleeding.  EXAM: LEFT FOOT - COMPLETE 3+ VIEW  COMPARISON:  LEFT foot radiograph October 11, 2014  FINDINGS: Interval fourth mid metatarsal amputation. No acute fracture deformity. No dislocation. Type 2 navicular bone. Mild first metatarsophalangeal osteoarthrosis. No destructive bony lesions. Mid and forefoot soft tissue swelling with gas consistent with recent surgery.  IMPRESSION: Status post RIGHT fourth mid metatarsal amputation with persistence soft tissue swelling and gas consistent with recent surgery.   Electronically Signed   By: Awilda Metroourtnay  Bloomer   On: 10/31/2014 01:37  Scheduled Meds: . aspirin EC  81 mg Oral Daily  . atorvastatin  80 mg Oral q1800  . insulin aspart  0-9 Units Subcutaneous TID WC  . piperacillin-tazobactam (ZOSYN)  IV  3.375 g Intravenous 3 times per day  . sodium chloride  3 mL Intravenous Q12H  . vancomycin  1,250 mg Intravenous Q12H   Continuous Infusions: . sodium chloride 100 mL/hr at 10/31/14 0202      Time spent: 25 minutes    Edna Grover  [facility] Pager [phone]. If 7PM-7AM, please contact night-coverage at [email or web address], password [email or web address] 10/31/2014, 1:00 PM  LOS: 1 day

## 2014-10-31 NOTE — Plan of Care (Signed)
Problem: Consults Goal: Diabetes Mellitus Patient Education See Patient Education Module for education specifics. Outcome: Completed/Met Date Met:  10/31/14 Goal: Diagnosis-Diabetes Mellitus Outcome: Completed/Met Date Met:  10/31/14 New Onset Type II  Goal: Skin Care Protocol Initiated - if Braden Score 18 or less If consults are not indicated, leave blank or document N/A Outcome: Completed/Met Date Met:  10/31/14 Large wound between 3rd and 5th toes on left foot (wound dehisced)  Goal: Diabetes Guidelines if Diabetic/Glucose > 140 If diabetic or lab glucose is > 140 mg/dl - Initiate Diabetes/Hyperglycemia Guidelines & Document Interventions  Outcome: Progressing  Problem: Phase I Progression Outcomes Goal: Pain controlled with appropriate interventions Outcome: Progressing Goal: Initial discharge plan identified Outcome: Progressing Goal: Voiding-avoid urinary catheter unless indicated Outcome: Completed/Met Date Met:  10/31/14 Goal: Hemodynamically stable Outcome: Completed/Met Date Met:  10/31/14 Goal: Diabetes Coordinator Consult Outcome: Completed/Met Date Met:  10/31/14 Ordered 12/3

## 2014-10-31 NOTE — Progress Notes (Signed)
Utilization review completed.  

## 2014-10-31 NOTE — H&P (Signed)
[facility] History and Physical  Jerome Gomez AVW:[id number] DOB: March 16, 1956 DOA: 11/21/2014  Referring physician: ER physician. PCP: No PCP Per Patient   Chief Complaint: Left foot wound bleeding.  HPI: Jerome Ekehillip E Hamme is a 58 y.o. male who was recently diagnosed with peripheral arterial disease and undergone stenting of his SFA on his left lower extremity and had gangrene of his left fourth toe which was amputated last week by Dr. Lajoyce Cornersuda presents to the ER because of persistent bleeding from the surgical site. Patient had initially come to the ER and at that time was found to be hypotensive and was given 6 L of normal saline and discharged home. Patient presented back because of persistent bleeding. Patient was found to be hypotensive and blood work show leukocytosis. Patient is afebrile otherwise. Patient does not complain of any significant pain at the surgical site. Patient also was recently diagnosed with diabetes mellitus. Since there is concern of persistent hypotension and infectious etiology from the left foot wound patient has been in pretty started on antibiotics and will be admitted for possible developing sepsis. Patient states Elina day was feeling weak and did also briefly have a syncopal episode. Patient otherwise denies any chest pain or shortness of breath nausea vomiting abdominal pain or diarrhea.   Review of Systems: As presented in the history of presenting illness, rest negative.  Past Medical History  Diagnosis Date  . Gout   . New onset type 2 diabetes mellitus 10/14/2014  . Peripheral vascular disease    Past Surgical History  Procedure Laterality Date  . Bone cyst excision Left     leg  . Amputation Left 10/18/2014    Procedure: 4th Ray Amputation;  Surgeon: Nadara MustardMarcus Duda V, MD;  [facility];  Service: Orthopedics;  Laterality: Left;   Social History:  reports that he has been smoking Cigarettes.  He has a 7.5 pack-year smoking history. He has  never used smokeless tobacco. He reports that he does not drink alcohol or use illicit drugs. Where does patient live home. Can patient participate in ADLs? Yes.  No Known Allergies  Family History:  Family History  Problem Relation Age of Onset  . Peripheral vascular disease Brother       Prior to Admission medications   Medication Sig Start Date End Date Taking? Authorizing Provider  aspirin EC 81 MG EC tablet Take 1 tablet (81 mg total) by mouth daily. 10/16/14  Yes Pamella PertJagadeesh R Ganji, MD  atorvastatin (LIPITOR) 80 MG tablet Take 1 tablet (80 mg total) by mouth daily at 6 PM. 10/16/14  Yes Pamella PertJagadeesh R Ganji, MD  clopidogrel (PLAVIX) 75 MG tablet Take 1 tablet (75 mg total) by mouth daily. 10/16/14  Yes Pamella PertJagadeesh R Ganji, MD  metFORMIN (GLUCOPHAGE) 500 MG tablet Take 1 tablet (500 mg total) by mouth 2 (two) times daily with a meal. 10/16/14  Yes Pamella PertJagadeesh R Ganji, MD  Oil of Oregano 1500 MG CAPS Take 2 capsules by mouth daily as needed (for infection).   Yes Historical Provider, MD  oxyCODONE-acetaminophen (PERCOCET) 10-325 MG per tablet Take 1 tablet by mouth every 4 (four) hours as needed for pain. 10/16/14  Yes Pamella PertJagadeesh R Ganji, MD  traMADol (ULTRAM) 50 MG tablet Take 50 mg by mouth every 6 (six) hours as needed for moderate pain.   Yes Historical Provider, MD  vitamin C (ASCORBIC ACID) 500 MG tablet Take 500 mg by mouth daily.   Yes Historical Provider, MD  ampicillin (PRINCIPEN) 500  MG capsule Take 1 capsule (500 mg total) by mouth 4 (four) times daily. Patient not taking: Reported on 11/21/2014 10/16/14   Pamella PertJagadeesh R Ganji, MD  aspirin EC 325 MG tablet Take 1 tablet (325 mg total) by mouth daily. 10/18/14   Nadara MustardMarcus Duda V, MD  living well with diabetes book MISC 1 each by Does not apply route once. 10/16/14   Pamella PertJagadeesh R Ganji, MD    Physical Exam: Filed Vitals:   10/31/2014 2330 11/08/2014 2345 10/31/14 0000 10/31/14 0015  BP: 99/58 100/59 92/41 100/59  Pulse: 92 92 84 86  Temp:       TempSrc:      Resp: 14 16 23 13   Height:      Weight:      SpO2: 100% 100% 99% 100%     General:  Well-developed and nourished.  Eyes: Anicteric no pallor.  ENT: No discharge from the ears eyes nose and mouth.  Neck: No mass felt.  Cardiovascular: S1 and S2 heard.  Respiratory: No rhonchi or crepitations.  Abdomen: Soft nontender bowel sounds present.  Skin: Skin on the left foot has granulation tissue with no active discharge at the surgical site.  Musculoskeletal: Left foot is mildly erythematous and the surgical site does not have any active discharge or skin looks erythematous.  Psychiatric: Appears normal.  Neurologic: Alert awake oriented to time place and person. Moves all extremities.  Labs on Admission:  Basic Metabolic Panel:  Recent Labs Lab 11/09/2014 1340 11/10/2014 2204  NA 139 137  K 5.1 4.1  CL 102 102  CO2 26 20  GLUCOSE 184* 189*  BUN 10 7  CREATININE 0.75 0.57  CALCIUM 8.9 8.3*   Liver Function Tests:  Recent Labs Lab 11/23/2014 1340 11/28/2014 2204  AST 15 12  ALT 17 13  ALKPHOS 71 57  BILITOT 0.4 0.3  PROT 7.1 6.1  ALBUMIN 3.0* 2.6*   No results for input(s): LIPASE, AMYLASE in the last 168 hours. No results for input(s): AMMONIA in the last 168 hours. CBC:  Recent Labs Lab 11/10/2014 1340 11/06/2014 2204  WBC 18.2* 16.9*  NEUTROABS 15.1* 12.5*  HGB 11.3* 10.2*  HCT 34.5* 29.5*  MCV 87.3 89.7  PLT 496* 483*   Cardiac Enzymes: No results for input(s): CKTOTAL, CKMB, CKMBINDEX, TROPONINI in the last 168 hours.  BNP (last 3 results) No results for input(s): PROBNP in the last 8760 hours. CBG:  Recent Labs Lab 11/23/2014 1230  GLUCAP 158*    Radiological Exams on Admission: No results found.   Assessment/Plan Principal Problem:   SIRS (systemic inflammatory response syndrome) Active Problems:   PAD (peripheral artery disease)   Diabetes mellitus type 2, controlled   Blood loss anemia   1. SIRS probable source  could be the left foot - patient has been placed on vancomycin and Zosyn for now as patient's recent wound culture showed Streptococcus. Patient recently finished a course of Augmentin. Follow blood cultures. Check sedimentation rate and CRP and get MRI of the left foot. Consult Dr. Lajoyce Cornersuda patient's orthopedist in a.m. If patient's left foot continues to bleed then may have to discuss with Dr. Jacinto HalimGanji as patient has been recently placed on aspirin and Plavix after stent was placed on his left SFA. 2. Recent stent placed in the left SFA for peripheral arterial disease - on Plavix and aspirin. If patient's foot continues to bleed and may have to discuss with Dr. Jacinto HalimGanji about holding aspirin and Plavix. Closely follow CBC. Closely follow  CBC. 3. Syncopal episode probably from hypotension - continue to monitor in telemetry. Continue hydration. 4. Recently diagnosed diabetes mellitus type 2 - hold metformin while inpatient and patient has been placed on sliding scale coverage. 5. Ongoing tobacco abuse - patient advised to quit tobacco. 6. Blood loss anemia - closely follow CBC. See #1 and 2 regarding blood loss.  Monitor shows sinus rhythm and EKG is pending.  Code Status: Full code.  Family Communication: None.  Disposition Plan: Admit to inpatient.    KAKRAKANDY,ARSHAD N. [facility] Pager [phone].  If 7PM-7AM, please contact night-coverage [email or web address] Password [email or web address] 10/31/2014, 12:53 AM

## 2014-11-01 ENCOUNTER — Other Ambulatory Visit: Payer: Self-pay

## 2014-11-01 ENCOUNTER — Inpatient Hospital Stay (HOSPITAL_COMMUNITY): Payer: No Typology Code available for payment source

## 2014-11-01 ENCOUNTER — Encounter (HOSPITAL_COMMUNITY): Admission: EM | Disposition: E | Payer: Self-pay | Source: Home / Self Care | Attending: Internal Medicine

## 2014-11-01 ENCOUNTER — Ambulatory Visit (HOSPITAL_COMMUNITY): Admission: RE | Admit: 2014-11-01 | Payer: MEDICAID | Source: Ambulatory Visit | Admitting: Orthopedic Surgery

## 2014-11-01 DIAGNOSIS — R6520 Severe sepsis without septic shock: Secondary | ICD-10-CM | POA: Diagnosis present

## 2014-11-01 DIAGNOSIS — I469 Cardiac arrest, cause unspecified: Secondary | ICD-10-CM

## 2014-11-01 DIAGNOSIS — I249 Acute ischemic heart disease, unspecified: Secondary | ICD-10-CM | POA: Diagnosis present

## 2014-11-01 DIAGNOSIS — A419 Sepsis, unspecified organism: Secondary | ICD-10-CM | POA: Diagnosis present

## 2014-11-01 LAB — BASIC METABOLIC PANEL
Anion gap: 25 — ABNORMAL HIGH (ref 5–15)
Anion gap: 32 — ABNORMAL HIGH (ref 5–15)
BUN: 10 mg/dL (ref 6–23)
BUN: 11 mg/dL (ref 6–23)
CALCIUM: 8.5 mg/dL (ref 8.4–10.5)
CO2: 12 mEq/L — ABNORMAL LOW (ref 19–32)
CO2: 8 meq/L — AB (ref 19–32)
CREATININE: 0.77 mg/dL (ref 0.50–1.35)
CREATININE: 1.08 mg/dL (ref 0.50–1.35)
Calcium: 7.9 mg/dL — ABNORMAL LOW (ref 8.4–10.5)
Chloride: 100 mEq/L (ref 96–112)
Chloride: 104 mEq/L (ref 96–112)
GFR calc Af Amer: >90 mL/min (ref >90)
GFR calc Af Amer: 87 mL/min — ABNORMAL LOW (ref >90)
GFR calc non Af Amer: 75 mL/min — ABNORMAL LOW (ref >90)
GLUCOSE: 210 mg/dL — AB (ref 70–99)
Glucose, Bld: 219 mg/dL — ABNORMAL HIGH (ref 70–99)
Potassium: 4.3 mEq/L (ref 3.7–5.3)
Potassium: 3.9 mEq/L (ref 3.7–5.3)
SODIUM: 137 meq/L (ref 137–147)
Sodium: 144 mEq/L (ref 137–147)

## 2014-11-01 LAB — CBC
HCT: 26.3 % — ABNORMAL LOW (ref 39.0–52.0)
HCT: 26.9 % — ABNORMAL LOW (ref 39.0–52.0)
HEMOGLOBIN: 8.2 g/dL — AB (ref 13.0–17.0)
Hemoglobin: 8.5 g/dL — ABNORMAL LOW (ref 13.0–17.0)
MCH: 28.5 pg (ref 26.0–34.0)
MCH: 28.4 pg (ref 26.0–34.0)
MCHC: 32.3 g/dL (ref 30.0–36.0)
MCHC: 30.5 g/dL (ref 30.0–36.0)
MCV: 88.3 fL (ref 78.0–100.0)
MCV: 93.1 fL (ref 78.0–100.0)
PLATELETS: 540 10*3/uL — AB (ref 150–400)
Platelets: 424 10*3/uL — ABNORMAL HIGH (ref 150–400)
RBC: 2.98 MIL/uL — AB (ref 4.22–5.81)
RBC: 2.89 MIL/uL — AB (ref 4.22–5.81)
RDW: 13.2 % (ref 11.5–15.5)
RDW: 13.2 % (ref 11.5–15.5)
WBC: 19.9 10*3/uL — ABNORMAL HIGH (ref 4.0–10.5)
WBC: 23.5 10*3/uL — AB (ref 4.0–10.5)

## 2014-11-01 LAB — BLOOD GAS, ARTERIAL
Acid-base deficit: 10.5 mmol/L — ABNORMAL HIGH (ref 0.0–2.0)
Bicarbonate: 13 mEq/L — ABNORMAL LOW (ref 20.0–24.0)
Drawn by: 31288
FIO2: 0.21 %
O2 SAT: 92.7 %
PATIENT TEMPERATURE: 98.6
TCO2: 13.6 mmol/L (ref 0–100)
pCO2 arterial: 19.9 mmHg — CL (ref 35.0–45.0)
pH, Arterial: 7.431 (ref 7.350–7.450)
pO2, Arterial: 63.7 mmHg — ABNORMAL LOW (ref 80.0–100.0)

## 2014-11-01 LAB — GLUCOSE, CAPILLARY: Glucose-Capillary: 206 mg/dL — ABNORMAL HIGH (ref 70–99)

## 2014-11-01 LAB — LACTIC ACID, PLASMA
LACTIC ACID, VENOUS: 7.6 mmol/L — AB (ref 0.5–2.2)
Lactic Acid, Venous: 15.1 mmol/L — ABNORMAL HIGH (ref 0.5–2.2)

## 2014-11-01 LAB — TROPONIN I
TROPONIN I: 6 ng/mL — AB (ref <0.30)
Troponin I: 8.33 ng/mL (ref <0.30)

## 2014-11-01 LAB — D-DIMER, QUANTITATIVE: D-Dimer, Quant: 0.79 ug/mL-FEU — ABNORMAL HIGH (ref 0.00–0.48)

## 2014-11-01 LAB — PRO B NATRIURETIC PEPTIDE: Pro B Natriuretic peptide (BNP): 2885 pg/mL — ABNORMAL HIGH (ref 0–125)

## 2014-11-01 SURGERY — IRRIGATION AND DEBRIDEMENT EXTREMITY
Anesthesia: General | Laterality: Left

## 2014-11-01 MED ORDER — SODIUM BICARBONATE 8.4 % IV SOLN
INTRAVENOUS | Status: DC
  Filled 2014-11-01: qty 150

## 2014-11-01 MED ORDER — CHLORHEXIDINE GLUCONATE 0.12 % MT SOLN: 15.0000 mL | Freq: Two times a day (BID) | OROMUCOSAL | Status: DC

## 2014-11-01 MED ORDER — FENTANYL CITRATE 0.05 MG/ML IJ SOLN: 100.0000 ug | INTRAMUSCULAR | Status: DC | PRN

## 2014-11-01 MED ORDER — SODIUM CHLORIDE 0.9 % IV BOLUS (SEPSIS): 1000.0000 mL | Freq: Once | INTRAVENOUS | Status: DC

## 2014-11-01 MED ORDER — LORAZEPAM 2 MG/ML IJ SOLN
INTRAMUSCULAR | Status: AC
  Filled 2014-11-01: qty 1

## 2014-11-01 MED ORDER — MIDAZOLAM HCL 2 MG/2ML IJ SOLN
INTRAMUSCULAR | Status: AC
  Filled 2014-11-01: qty 2

## 2014-11-01 MED ORDER — ASPIRIN 325 MG PO TABS
325.0000 mg | ORAL_TABLET | Freq: Every day | ORAL | Status: DC
Start: 2014-11-01 — End: 2014-11-01
  Filled 2014-11-01: qty 1

## 2014-11-01 MED ORDER — NOREPINEPHRINE BITARTRATE 1 MG/ML IV SOLN
0.0000 ug/min | INTRAVENOUS | Status: DC
  Filled 2014-11-01: qty 4

## 2014-11-01 MED ORDER — NICOTINE POLACRILEX 2 MG MT GUM
2.0000 mg | CHEWING_GUM | OROMUCOSAL | Status: DC | PRN
  Administered 2014-11-01: 2 mg via ORAL
  Filled 2014-11-01 (×2): qty 1

## 2014-11-01 MED ORDER — CETYLPYRIDINIUM CHLORIDE 0.05 % MT LIQD: 7.0000 mL | Freq: Four times a day (QID) | OROMUCOSAL | Status: DC

## 2014-11-01 MED ORDER — METOPROLOL TARTRATE 1 MG/ML IV SOLN
INTRAVENOUS | Status: AC
  Filled 2014-11-01: qty 5

## 2014-11-01 MED ORDER — METOPROLOL TARTRATE 1 MG/ML IV SOLN: 5.0000 mg | Freq: Once | INTRAVENOUS | Status: DC

## 2014-11-01 MED ORDER — SODIUM CHLORIDE 0.9 % IV SOLN: Freq: Once | INTRAVENOUS | Status: DC

## 2014-11-01 MED ORDER — SODIUM CHLORIDE 0.9 % IV SOLN: INTRAVENOUS | Status: DC

## 2014-11-01 MED ORDER — FENTANYL CITRATE 0.05 MG/ML IJ SOLN
INTRAMUSCULAR | Status: AC
  Filled 2014-11-01: qty 2

## 2014-11-01 MED ORDER — INSULIN ASPART 100 UNIT/ML ~~LOC~~ SOLN: 0.0000 [IU] | SUBCUTANEOUS | Status: DC

## 2014-11-01 MED ORDER — VASOPRESSIN 20 UNIT/ML IV SOLN
0.0100 [IU]/min | INTRAVENOUS | Status: DC
  Filled 2014-11-01: qty 2

## 2014-11-01 MED ORDER — SODIUM CHLORIDE 0.9 % IV BOLUS (SEPSIS)
500.0000 mL | Freq: Once | INTRAVENOUS | Status: AC
  Administered 2014-11-01: 500 mL via INTRAVENOUS

## 2014-11-02 MED FILL — Medication: Qty: 1 | Status: AC

## 2014-11-02 MED FILL — Medication: Qty: 1 | Status: CN

## 2014-11-04 MED FILL — Medication: Qty: 1 | Status: AC

## 2014-11-05 ENCOUNTER — Ambulatory Visit: Payer: Self-pay

## 2014-11-05 LAB — POCT I-STAT 3, ART BLOOD GAS (G3+)
ACID-BASE DEFICIT: 19 mmol/L — AB (ref 0.0–2.0)
Bicarbonate: 11.8 mEq/L — ABNORMAL LOW (ref 20.0–24.0)
O2 SAT: 90 %
TCO2: 13 mmol/L (ref 0–100)
pCO2 arterial: 53.1 mmHg — ABNORMAL HIGH (ref 35.0–45.0)
pH, Arterial: 6.955 — CL (ref 7.350–7.450)
pO2, Arterial: 91 mmHg (ref 80.0–100.0)

## 2014-11-05 LAB — POCT I-STAT, CHEM 8
BUN: 10 mg/dL (ref 6–23)
Calcium, Ion: 0.76 mmol/L — ABNORMAL LOW (ref 1.12–1.23)
Chloride: 104 mEq/L (ref 96–112)
Creatinine, Ser: 1.2 mg/dL (ref 0.50–1.35)
Glucose, Bld: 214 mg/dL — ABNORMAL HIGH (ref 70–99)
HEMATOCRIT: 17 % — AB (ref 39.0–52.0)
HEMOGLOBIN: 5.8 g/dL — AB (ref 13.0–17.0)
Potassium: 4.1 mEq/L (ref 3.7–5.3)
SODIUM: 155 meq/L — AB (ref 137–147)
TCO2: 22 mmol/L (ref 0–100)

## 2014-11-06 LAB — CULTURE, BLOOD (ROUTINE X 2)
CULTURE: NO GROWTH
Culture: NO GROWTH

## 2014-11-07 ENCOUNTER — Ambulatory Visit: Payer: Self-pay

## 2014-11-07 ENCOUNTER — Encounter (HOSPITAL_COMMUNITY): Payer: Self-pay | Admitting: Cardiology

## 2014-11-12 ENCOUNTER — Ambulatory Visit: Payer: Self-pay

## 2014-11-14 ENCOUNTER — Ambulatory Visit: Payer: Self-pay

## 2014-11-29 NOTE — Progress Notes (Signed)
Patient received from 2W, very anxious, BP 70/54, rr 40's, hrt 130's, 97% on 2L nasal canula. Bolus of ns infusing.

## 2014-11-29 NOTE — Progress Notes (Signed)
While on the unit, chaplain was told this pt had coded this morning and that family was present. Chaplain checked in briefly with pt's son and wife. They declined chaplain support at this time. Page if needed.  Wille GlaserMcCray, Carolle Ishii O, Chaplain 11/21/2014 7:55 AM

## 2014-11-29 NOTE — H&P (View-Only) (Signed)
  Patient is approximately 2 weeks status post fourth ray amputation left foot. Patient called my office yesterday stating that he developed acute bleeding. He was seen in the office urgently yesterday and was hypotensive. Patient was sent by EMS to the emergency room and was hydrated. Patient's blood pressure improved he refused admission and was discharged back to home. Patient subsequently developed more bleeding and presented back to the emergency room and was admitted. Patient has been on chronic Plavix.  Patient does have an odor from the left foot. There is no active bleeding at this time. His hemoglobin is 10 was 11 yesterday.  Assessment open wound left foot fourth ray amputation with acute bleeding 2 weeks postoperatively.  Plan: We will plan for irrigation and debridement placement of antibiotic beads tomorrow Friday afternoon. Patient will need to be off his anticoagulation medicine.

## 2014-11-29 NOTE — Discharge Summary (Signed)
Note dictated on 11/03/2014 at 7:49AM. THIS is an addendum dictation to Dr. Flonnie Overman Dhungel's prior note and he and I have discussed the case and we seem to concur with the opinion.  I was asked to see the patient on 11-05-14, Stating that he has had a cardiac arrest, his serum troponins were elevated and he had abnormal EKG.  I was present during this CPR, and also management of acute shock.  My suspicion is that patient on the previous evening on 10/31/2014 for the very first time complaining about shortness of breath as per his wife.  But it was felt to be due to his tobacco use, probable COPD and also foot infection and anemia.  His hemodynamics were stable.  Patient had acute shock leading to his eventual demise.  My differential diagnosis include 1.  Acute pulmonary embolism 2.  Non-ST elevation myocardial infarction 3.  Anemia due to blood loss from recent Amputation of his left toe 4.  Tobacco use disorder 5.  Sepsis  His examination during the process of shock did not appear to suggest sepsis as a major indicator.  The was no mottling of the skin, sepsis was not as florid that he would not resuscitate with fluids and inotropic support.  Myocardial infarction leading to cardiac shock is also unlikely with comparatively mild lateral wall ST segment depression. Tthese may have been contributing to his mortality. It would be extremely difficult to prove or disprove the diagnosis, as he deteriorated very quickly and even if the suspicion for pulmonary embolism was there, he was not stable for CT angiogram of the chest or pulmonary angiography.Unfortunately although patient very young, in spite of his multiple medical comorbidities he really never took care of himself well.  I met with his wife and the family members and they were very pleased with the care he received and kept stating that he really never took care of himself.

## 2014-11-29 NOTE — Discharge Summary (Signed)
Physician Discharge Summary  OSKER AYOUB ZOX:[id number] DOB: Apr 03, 1956 DOA: 11/14/2014  PCP: No PCP Per Patient  Admit date: 11/19/2014 Discharge date: 11/21/2014  Time spent: 35 minutes    Discharge Diagnoses:  Principal Problem: Cardiac arrest secondary to NSTEMI   SIRS (systemic inflammatory response syndrome)  Active Problems:   PAD (peripheral artery disease)   Diabetes mellitus type 2, controlled   Blood loss anemia   Tobacco abuse   Severe sepsis   ACS (acute coronary syndrome)    Discharge Condition: Patient expired     Filed Weights   11/12/2014 2155 10/31/14 0113  Weight: 90.719 kg (200 lb) 90.946 kg (200 lb 8 oz)    History of present illness:  Brief narrative 59 year old male with recently diagnosed diabetes mellitus, tobacco use disorder, hyperlipidemia, recently diagnosed peripheral artery disease status post stenting of his left SFA on 11/17 and placed on aspirin and Plavix with finding of 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 Patient initially came to the ED when he was found to be hypotensive and given 6 L of normal saline and discharged home. He presented back to the ED due to persistent bleeding and again was found to be hypotensive with blood work showing leukocytosis. Patient denied any pain to the surgical site and was afebrile. She however did feel weak and earlier during the day had all syncopal episode. With concern for sepsis he was started on empiric antibiotic and admitted to hospitalist service. Orthopedics Dr. Lajoyce Corners consulted. Patient was placed on empiric abx. On 12/4 am pt became hypotensive with persistent tachycardia and EKG showing SVT. Was hypotensive with SBP of 70s. EKG showed sinus tachycardia with ST depression in lateral leads. Had markedly elevated troponin and was transferred to ICU.   Assessment/Plan:  Cardiac arrest due to  NSTEMI Patient tachycardic with hypotension on 12/4 . EKG with ST depression on lateral leads and elevated troponin. Given IV fluid and transferred to ICU when very shortly upon arrival pt went in to cardiac arrest. He was intubated and resuscitation started. He recovered with resuscitation for 20 minutes. He had recurrent cardiac arrest and required resuscitation or 6 mins  and started on pressors. He was acidotic and hyperkalemic. He again developed cardiac arrest and asystole. family was informed and pt subsequently expired.   Sepsis secondary to infected left foot wound Patient placed on empiric vancomycin and Zosyn. Recent wound culture growing Streptococcus.  recently completed a course of ampicillin. Blood cultures sent on admission no growth till date. MRI of the foot done shows large open wound with complex abscess extending between the third and fifth metatarsals with surrounding myositis and cellulitis. No signs of osteomyelitis. -Supportive care with IV fluids and Tylenol -Dr. Lajoyce Corners planned on irrigation and debridement with placement of antibiotic beads on 12/4. -held  ASA and plavix  On 12/3  Due to active left foot bleeding after discussing with his cardiologist Dr Jacinto Halim.  Peripheral artery disease with recent left SFA stent Hold aspirin and Plavix. Monitor H&H  Acute blood loss anemia  secondary to bleeding from his left foot. Held  aspirin and Plavix.   Syncope Likely secondary to hypotension and sepsis.   Type 2 diabetes mellitus Recently diagnosed with A1c of 8.1.   Ongoing tobacco use  Patient was counseled strongly on quitting.      Family Communication: wife and daughters at bedside Disposition Plan: Currently  inpatient, will reevaluate after water tomorrow   Consultants:  Dr. Lajoyce Corners    Procedures:  For irrigation and debridement on 12/4  MRI left foot  Antibiotics:  vancomycin and Zosyn since 12/3     Procedures:  MRI left  foot    Consultations:  ICU   Dr Lajoyce Corners   Dr Jacinto Halim                                                                            No Known Allergies    The results of significant diagnostics from this hospitalization (including imaging, microbiology, ancillary and laboratory) are listed below for reference.    Significant Diagnostic Studies: Dg Chest 2 View  10/11/2014   CLINICAL DATA:  Preop cardiovascular exam, smoker, gangrene of digit  EXAM: CHEST  2 VIEW  COMPARISON:  None.  FINDINGS: Cardiomediastinal silhouette is unremarkable. No acute infiltrate or pleural effusion. No pulmonary edema. Bony thorax is unremarkable.  IMPRESSION: No active cardiopulmonary disease.   Electronically Signed   By: Natasha Mead M.D.   On: 10/11/2014 18:38   Dg Tibia/fibula Left  10/11/2014   CLINICAL DATA:  Lower extremity swelling with redness, no known trauma, initial encounter  EXAM: LEFT TIBIA AND FIBULA - 2 VIEW  COMPARISON:  None.  FINDINGS: No acute fracture or dislocation is noted. Small calcaneal spurs are seen. No gross soft tissue abnormality is noted.  IMPRESSION: No acute abnormality noted.   Electronically Signed   By: Alcide Clever M.D.   On: 10/11/2014 11:49   Ct Angio Ao+bifem W/cm &/or Wo/cm  10/11/2014   CLINICAL DATA:  Left foot ischemia with purple fourth toe, check for possible embolic source  EXAM: CT ANGIOGRAPHY OF ABDOMINAL AORTA WITH ILIOFEMORAL RUNOFF  TECHNIQUE: Multidetector CT imaging of the abdomen, pelvis and lower extremities was performed using the standard protocol during bolus administration of intravenous contrast. Multiplanar CT image reconstructions and MIPs were obtained to evaluate the vascular anatomy.  CONTRAST:  50mL OMNIPAQUE IOHEXOL 350 MG/ML SOLN, OMNIPAQUE IOHEXOL 350 MG/ML SOLN  COMPARISON:  None.  FINDINGS: Aorta: The abdominal aorta is well visualized and demonstrates some irregular soft atherosclerotic plaque throughout  the infrarenal segment. Very mild ectasia is noted with maximum diameter of 3.0 x 2.9 cm. Given the irregular nature of the atherosclerotic plaque this is likely the etiology of the patient's embolic disease. The celiac axis, superior mesenteric artery and inferior mesenteric artery are all widely patent. Single renal arteries are identified bilaterally without focal stenosis.  Right Lower Extremity: The right common iliac artery demonstrates some calcified and noncalcified plaque. No focal stenosis is noted. The external iliac artery shows similar findings. There is stenosis of approximately 50% at the origin of the right superficial femoral artery. Beyond this focal stenosis superficial femoral artery is within normal limits. At the level of the adductor canal however there is a focal concentric high-grade stenosis identified with subsequent short segment occlusion. Reconstitution of the proximal popliteal artery is noted via muscular collaterals. The popliteal artery is patent but demonstrates some narrowing distally. The popliteal trifurcation is patent. The anterior tibial artery is occluded just beyond its origin. The peroneal and posterior tibial arteries are patent to the  level of the ankle.  Left Lower Extremity: The left common external iliac arteries show similar findings to that on the right. A focal 40-50% stenosis of the proximal external iliac artery is noted on the left. Common femoral bifurcation is patent. The superficial femoral artery demonstrates scattered atherosclerotic change throughout its course with focal short segment occlusion at the level of the adductor canal. Multiple muscular collaterals reconstitute the popliteal artery after approximately 2-3 cm. It demonstrates scattered atherosclerotic change as well. The trifurcation of the popliteal artery on the left is widely patent with 2 vessel runoff to the level of the ankle. The peroneal artery occludes in the mid to distal calf.  Anterior tibial artery is difficult to follow in the foot. Posterior tibial artery extends to at least the level of the metatarsal heads. More distal arterial visualization is difficult.  Review of the MIP images confirms the above findings.  Nonvascular: The liver is mildly fatty infiltrated. The gallbladder, spleen, adrenal glands and pancreas are all normal in their CT appearance. The kidneys demonstrate a normal enhancement pattern. A few small cysts are noted. No obstructive changes are seen. Small periumbilical fat containing hernia is noted. No bowel is noted within.  Scattered diverticular change is seen without diverticulitis. The appendix and bladder are within normal limits as visualized. No significant lymphadenopathy is noted. No acute bony abnormality is seen.  IMPRESSION: Irregular soft atherosclerotic plaque within the abdominal aorta likely the source of the distal emboli.  Bilateral superficial femoral/popliteal artery occlusion at the level of the adductor canal. Additionally there is a 50% stenosis of the proximal right superficial femoral artery at its origin.  Two vessel runoff bilaterally involving the peroneal and posterior tibial artery on the right and the anterior and posterior tibial arteries on the left.  Chronic nonvascular changes as described above.   Electronically Signed   By: Alcide CleverMark  Lukens M.D.   On: 10/11/2014 19:12   Mr Foot Left W Wo Contrast  10/31/2014   CLINICAL DATA:  Status post amputation of the fourth metatarsal and toe with pain, swelling and bleeding from the surgical site.  EXAM: MRI OF THE LEFT FOREFOOT WITHOUT AND WITH CONTRAST  TECHNIQUE: Multiplanar, multisequence MR imaging was performed both before and after administration of intravenous contrast.  CONTRAST:  20mL MULTIHANCE GADOBENATE DIMEGLUMINE 529 MG/ML IV SOLN  COMPARISON:  Radiographs 10/31/2014  FINDINGS: There is a large open wound on the dorsum of the foot extending down into the surgical site between  the third and fifth metatarsals. There is a extensive rim enhancing abscess measuring a maximum of 4.2 x 1.4 cm. I do not see any definite findings to suggest osteomyelitis involving the remaining base of the fourth metatarsal. The fat saturation is poor and there is significant motion artifact but I do not see any obvious changes of osteomyelitis involving the third or fifth metatarsals or proximal phalanges.  IMPRESSION: Limited examination due to motion artifact and poor fat saturation.  No definite findings for osteomyelitis.  Large open wound with a complex abscess extending down between the third and fifth metatarsals. Surrounding myositis and cellulitis.   Electronically Signed   By: Loralie ChampagneMark  Gallerani M.D.   On: 10/31/2014 11:39   Dg Chest Port 1 View  11/21/2014   CLINICAL DATA:  Worsening shortness of breath, acute onset. Initial encounter.  EXAM: PORTABLE CHEST - 1 VIEW  COMPARISON:  Chest radiograph and CTA of the chest performed 10/11/2014  FINDINGS: The lungs are well-aerated. Right basilar airspace  opacification is noted. There is no evidence of pleural effusion or pneumothorax.  The cardiomediastinal silhouette is borderline normal in size. No acute osseous abnormalities are seen.  IMPRESSION: Right basilar airspace opacification raises concern for pneumonia, though mild interstitial edema might have a similar appearance.   Electronically Signed   By: Roanna Raider M.D.   On: 11-20-2014 04:46   Dg Foot Complete Left  10/31/2014   CLINICAL DATA:  Amputation 1 week ago, persistent bleeding.  EXAM: LEFT FOOT - COMPLETE 3+ VIEW  COMPARISON:  LEFT foot radiograph October 11, 2014  FINDINGS: Interval fourth mid metatarsal amputation. No acute fracture deformity. No dislocation. Type 2 navicular bone. Mild first metatarsophalangeal osteoarthrosis. No destructive bony lesions. Mid and forefoot soft tissue swelling with gas consistent with recent surgery.  IMPRESSION: Status post RIGHT fourth mid  metatarsal amputation with persistence soft tissue swelling and gas consistent with recent surgery.   Electronically Signed   By: Awilda Metro   On: 10/31/2014 01:37   Dg Foot Complete Left  10/11/2014   CLINICAL DATA:  Pain with soft tissue swelling. Evidence of bruising. No known trauma.  EXAM: LEFT FOOT - COMPLETE 3+ VIEW  COMPARISON:  None.  FINDINGS: Frontal, oblique, and lateral views were obtained. There is no demonstrable fracture or dislocation. There is mild narrowing of the first MTP joint as well as all DIP joints. There is no erosive change or bony destruction. There is an os naviculare, an anatomic variant. There is a small posterior calcaneal spur.  IMPRESSION: Narrowing of several distal joints. No fracture or dislocation. No erosive change or bony destruction.   Electronically Signed   By: Bretta Bang M.D.   On: 10/11/2014 11:44   Ct Angio Chest Aortic Dissect W &/or W/o  10/11/2014   CLINICAL DATA:  History of left foot ischemia, check for embolic source.  EXAM: CT ANGIOGRAPHY CHEST WITH CONTRAST  TECHNIQUE: Multidetector CT imaging of the chest was performed using the standard protocol during bolus administration of intravenous contrast. Multiplanar CT image reconstructions and MIPs were obtained to evaluate the vascular anatomy.  CONTRAST:  50mL OMNIPAQUE IOHEXOL 350 MG/ML SOLN, OMNIPAQUE IOHEXOL 350 MG/ML SOLN  COMPARISON:  None.  FINDINGS: The lungs are well aerated bilaterally without focal infiltrate or sizable effusion. No parenchymal nodule is noted.  The thoracic inlet shows some mild heterogeneity to the thyroid without definitive nodule. The thoracic aorta show some mild calcific changes. Very mild soft atherosclerotic plaque is noted in the descending thoracic aorta. No dissection or aneurysmal dilatation is seen. The origins of the brachiocephalic vessels are within normal limits.  The cardiac structures show no acute abnormality. No large thrombus is noted  within the left atrium. No hilar or mediastinal adenopathy is seen. No gross pulmonary embolism is seen. The osseous structures as visualized are within normal limits.  Review of the MIP images confirms the above findings.  IMPRESSION: Mild atherosclerotic changes within the thoracic aorta without aneurysmal dilatation or dissection. No definitive source for embolic material is identified.  No other focal abnormality is noted.   Electronically Signed   By: Alcide Clever M.D.   On: 10/11/2014 18:59    Microbiology: Recent Results (from the past 240 hour(s))  Culture, blood (routine x 2)     Status: None (Preliminary result)   Collection Time: 11/23/2014 10:04 PM  Result Value Ref Range Status   Specimen Description BLOOD RIGHT ANTECUBITAL  Final   Special Requests BOTTLES DRAWN AEROBIC ONLY 1CC  Final   Culture  Setup Time   Final    10/31/2014 04:29 Performed at [facility]    Culture   Final           BLOOD CULTURE RECEIVED NO GROWTH TO DATE CULTURE WILL BE HELD FOR 5 DAYS BEFORE ISSUING A FINAL NEGATIVE REPORT Performed at [facility]    Report Status PENDING  Incomplete  Culture, blood (routine x 2)     Status: None (Preliminary result)   Collection Time: 11/06/2014 10:33 PM  Result Value Ref Range Status   Specimen Description BLOOD RIGHT HAND  Final   Special Requests BOTTLES DRAWN AEROBIC AND ANAEROBIC 5CC EACH  Final   Culture  Setup Time   Final    10/31/2014 04:32 Performed at [facility]    Culture   Final           BLOOD CULTURE RECEIVED NO GROWTH TO DATE CULTURE WILL BE HELD FOR 5 DAYS BEFORE ISSUING A FINAL NEGATIVE REPORT Performed at [facility]    Report Status PENDING  Incomplete     Labs: Basic Metabolic Panel:  Recent Labs Lab 11/26/2014 1340 11/22/2014 2204 10/31/14 0319 11/04/14 0357 11/04/14 0615  NA 139 137 141 137 144  K 5.1 4.1 4.5 4.3 3.9  CL 102 102 107 100 104  CO2 26 20 24  12* 8*  GLUCOSE 184* 189* 145* 210* 219*   BUN 10 7 7 10 11   CREATININE 0.75 0.57 0.70 0.77 1.08  CALCIUM 8.9 8.3* 8.1* 8.5 7.9*   Liver Function Tests:  Recent Labs Lab 11/27/2014 1340 11/17/2014 2204 10/31/14 0319  AST 15 12 10   ALT 17 13 11   ALKPHOS 71 57 52  BILITOT 0.4 0.3 <0.2*  PROT 7.1 6.1 5.5*  ALBUMIN 3.0* 2.6* 2.4*   No results for input(s): LIPASE, AMYLASE in the last 168 hours. No results for input(s): AMMONIA in the last 168 hours. CBC:  Recent Labs Lab 11/09/2014 1340 11/19/2014 2204 10/31/14 0319 11/04/14 0357 11/04/14 0615  WBC 18.2* 16.9* 13.4* 19.9* 23.5*  NEUTROABS 15.1* 12.5* 8.9*  --   --   HGB 11.3* 10.2* 8.3* 8.5* 8.2*  HCT 34.5* 29.5* 25.0* 26.3* 26.9*  MCV 87.3 89.7 90.3 88.3 93.1  PLT 496* 483* 433* 540* 424*   Cardiac Enzymes:  Recent Labs Lab 11/04/14 0357 11/04/14 0615  TROPONINI 6.00* 8.33*   BNP: BNP (last 3 results)  Recent Labs  11/04/14 0357  PROBNP 2885.0*   CBG:  Recent Labs Lab 10/31/14 0617 10/31/14 1137 10/31/14 1623 10/31/14 2112 11/04/14 0732  GLUCAP 119* 147* 116* 171* 206*       Signed:  Tamaya Pun  [facility] 11/28/2014, 1:28 PM

## 2014-11-29 NOTE — Progress Notes (Signed)
Hypotensive with increasing tachycardia and svt and anxiety. Physician on call notified and rapid response called. Boluses given 12 lead EKG obtained, chest X-Ray and ABG. Transferred to [id number] stepdown

## 2014-11-29 NOTE — Progress Notes (Signed)
Pt HR noted to be increasing on tele 110-130s. Pt anxious sitting onside of bed. Pts primary RN made aware. MD paged and made aware. Will continue to monitor.

## 2014-11-29 NOTE — Progress Notes (Signed)
CRITICAL VALUE ALERT  Critical value received:  Troponin   Date of notification:  11/02/11  Time of notification:  0535  Critical value read back:Yes.    Nurse who received alert:  Raydon Chappuis  MD notified (1st page):  [facility],  Time of first page:  0550  MD notified (2nd page):  Time of second page:  Responding MD:  Samara DeistKathryn NP  Time MD responded:

## 2014-11-29 NOTE — Progress Notes (Signed)
The nurse notified me that patient was getting short of breath and hypotensive and patient is having persistent tachycardia with brief runs of SVT. On exam at bedside patient is mildly short of breath and complains of some chest tightness. Patient's blood pressure is around 70 systolic with heart rate around 130/m. Patient has bilateral air entry present on chest exam. Heart has S1-S2 heard tachycardic. EKG shows sinus tachycardia with ST depression in the lateral leads.  Assessment and plan - My impression was that patient was getting more septic and since patient also was complaining of chest discomfort and EKG was showing ST depression in the lateral leads I had ordered 2 more liters of fluid bolus normal saline and cardiac markers BNP ABG basic metabolic panel and repeat CBC. Labs show elevated troponin and BNP with chest x-ray showing possible pneumonia. Patient remains still hypotensive. ABG showed mixed picture. I did discuss with on-call pulmonary critical care Dr.Deterding who will be seeing patient in consult. Critical care also advised to keep patient on BiPAP for now. I also discussed with Dr. Jacinto HalimGanji, patient's cardiologist. Dr. Jacinto HalimGanji advised to start patient on heparin and aspirin to transfuse 1 unit of PRBC get 2-D echo and keep patient nothing by mouth in anticipation of possible cardiac catheterization.  Midge MiniumArshad Kakrakandy.

## 2014-11-29 NOTE — Progress Notes (Signed)
Patient received from 2W, anxious with low BP and elevated hrt. He was sitting on the side of the bed talking then went unconscious around 0611. ICU MD was at bedside, pt was bagged immediately and a code Blue was called. No pulse noted, compression started.  Dr. Toniann FailKakrakandy was notified and pt wife was called. Pt was subsequently transfer to 2H14.

## 2014-11-29 NOTE — Progress Notes (Signed)
59 yo male who was admitted on 10/31/14 with Lt foot bleeding 2nd to LLE gangrene and s/p recent amputation.  He was noted to have tachycardia and hypotension.  He was started on Abx and given IV fluids.  He was transferred to SDU and PCCM consulted.  On arrival to room the pt became unresponsive with PEA cardiac arrest.  Resuscitation was started immediately and he was intubated by anesthesia.  He had about 20 minutes before ROSC.  He was transferred to ICU.  He had recurrent cardiac arrest with about 6 minutes before ROSC.  He was started on multiple pressors, HCO3, and CaCl for presumed acidosis and hyperkalemia.  He again developed cardiac arrest with asystole.  The family was informed of this and pt subsequent expired.  Coralyn HellingVineet Jerritt Cardoza, MD [facility] 11/13/2014, 8:43 AM Pager:  [phone] After 3pm call: [phone]

## 2014-11-29 NOTE — Interval H&P Note (Signed)
History and Physical Interval Note:  11/08/2014 6:42 AM  Jerome EkePhillip E Gomez  has presented today for surgery, with the diagnosis of Dehiscence Wound Left Foot  The various methods of treatment have been discussed with the patient and family. After consideration of risks, benefits and other options for treatment, the patient has consented to  Procedure(s): Irrigation and Debridement, Wound Closure Left Foot 4th Ray Amputation Wound (Left) as a surgical intervention .  The patient's history has been reviewed, patient examined, no change in status, stable for surgery.  I have reviewed the patient's chart and labs.  Questions were answered to the patient's satisfaction.     Ashby Leflore V

## 2014-11-29 NOTE — Code Documentation (Signed)
CODE BLUE NOTE  Patient Name: Jerome Gomez   MRN: [id number]   Date of Birth/ Sex: 1956-04-14 , male      Admission Date: 11/07/2014  Attending Provider: Eddie NorthNishant Dhungel, MD  Primary Diagnosis: SIRS (systemic inflammatory response syndrome)    Indication: Pt was in his usual state of health until this AM, when he was noted to be hypotensive and unresponsive. Code blue was subsequently called. At the time of arrival on scene, ACLS protocol was underway. Internal medicine upper level resident Jerome Gomez was present and directed the code.     Technical Description:  - CPR performance duration:  11  minutes  - Was defibrillation or cardioversion used? No   - Was external pacer placed? No  - Was patient intubated pre/post CPR? Yes    Medications Administered: Y = Yes; Blank = No Amiodarone    Atropine    Calcium    Epinephrine  y  Lidocaine    Magnesium    Norepinephrine    Phenylephrine    Sodium bicarbonate  y  Vasopressin  y    Post CPR evaluation:  - Final Status - Was patient successfully resuscitated ? Yes - What is current rhythm? Sinus Tachycardia - What is current hemodynamic status? On levophed   Miscellaneous Information:  - Labs sent, including: CBC, BMP, Lactic Acid  - Primary team notified?  Yes  - Family Notified? No  - Additional notes/ transfer status:        Patient had was stabilized, placed on levophed drip, and transferred to 2H. Shortly after arrival to the unit, code was called again and ACLS protocol was underway by time of arrival to the scene. Internal medicine upper level resident Jerome Gomez was also present during this code. Patient's pulses were regained after a few rounds of CPR. CCM attending Dr Craige CottaSood was arrived to the patient's room and no longer required our assisstance.   Jacquiline Doealeb Parker, MD  11/23/2014, 7:20 AM

## 2014-11-29 DEATH — deceased

## 2015-12-02 IMAGING — CT CT ANGIO AOBIFEM WO/W CM
2 of 10 series · 12 of 46 positions shown, 15 images · IV contrast (APPLIED)
Comparison: None.

CLINICAL DATA: Left foot ischemia with purple fourth toe, check for
possible embolic source

EXAM:
CT ANGIOGRAPHY OF ABDOMINAL AORTA WITH ILIOFEMORAL RUNOFF
TECHNIQUE: Multidetector CT imaging of the abdomen, pelvis and lower
extremities was performed using the standard protocol during bolus
administration of intravenous contrast. Multiplanar CT image
reconstructions and MIPs were obtained to evaluate the vascular
anatomy.
CONTRAST:  50mL OMNIPAQUE IOHEXOL 350 MG/ML SOLN, 100mL OMNIPAQUE
IOHEXOL 350 MG/ML SOLN

[Series 4: angiorunoff 3.0 i30f 3 · axial · 0.96mm/px · z∈[+205,+1534]mm · 11 of 493 slices shown, 13 images]
[im 33/493  soft-tissue]
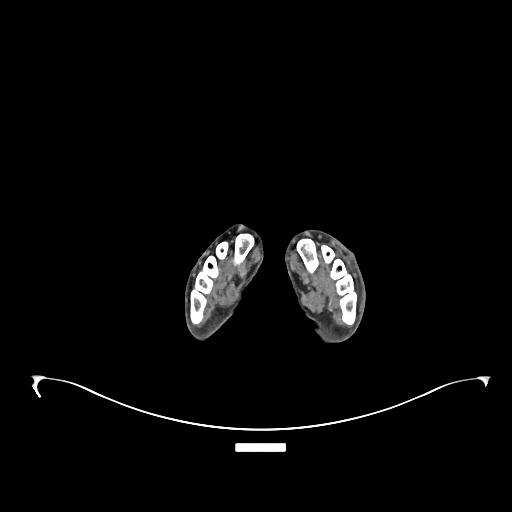
[im 33/493  bone]
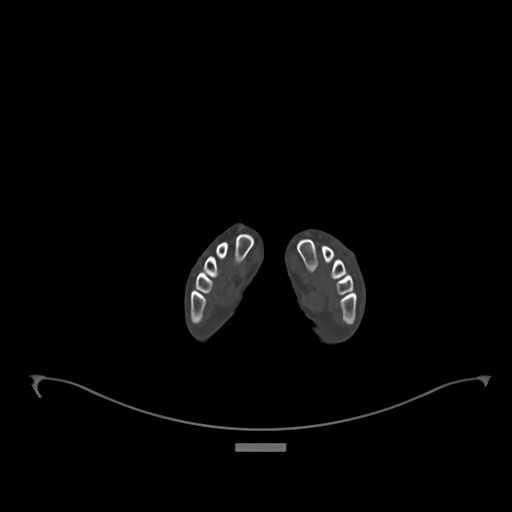
[im 99/493  soft-tissue]
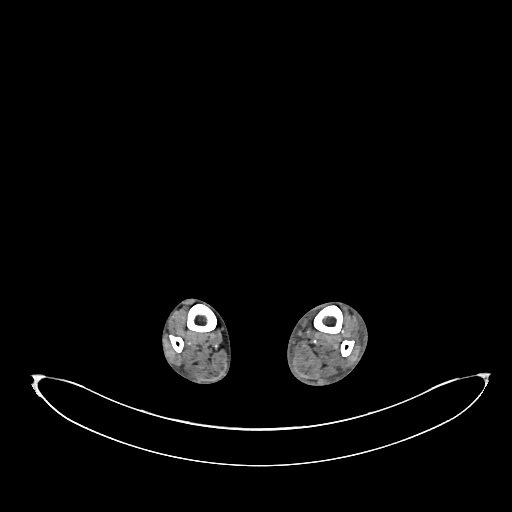
[im 165/493  soft-tissue]
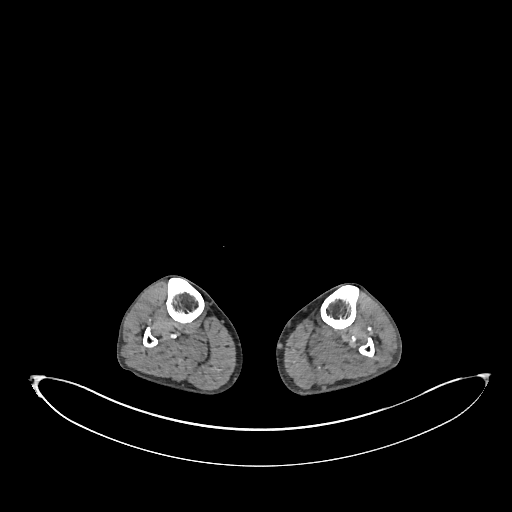
[im 214/493  soft-tissue]
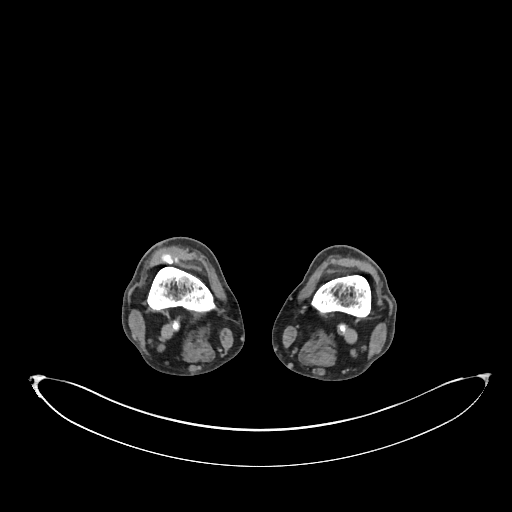
[im 279/493  soft-tissue]
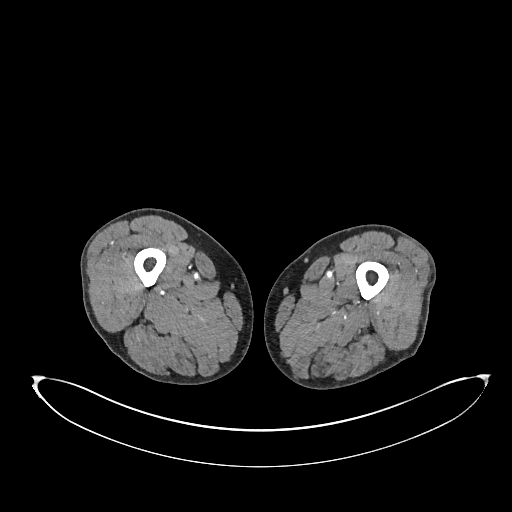
[im 329/493  soft-tissue]
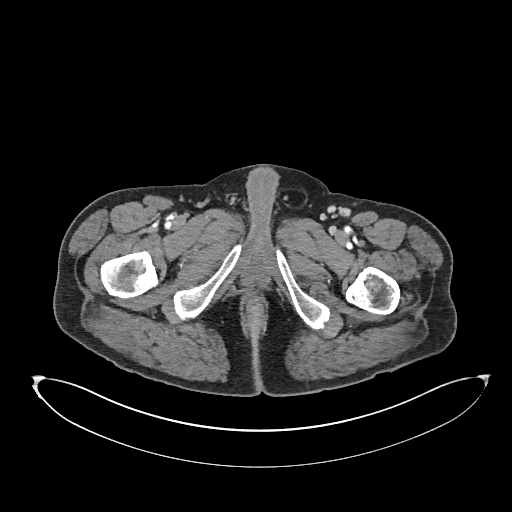
[im 394/493  soft-tissue]
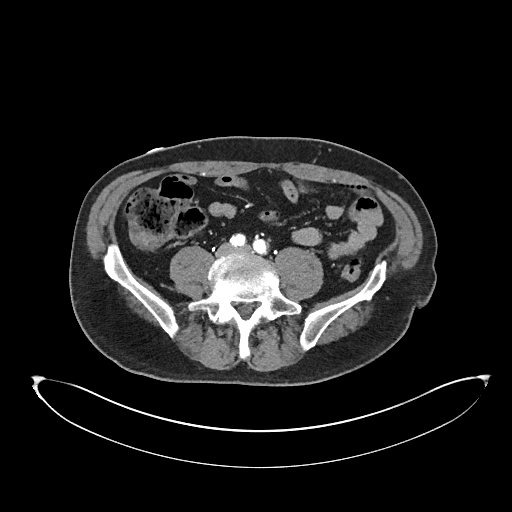
[im 427/493  lung]
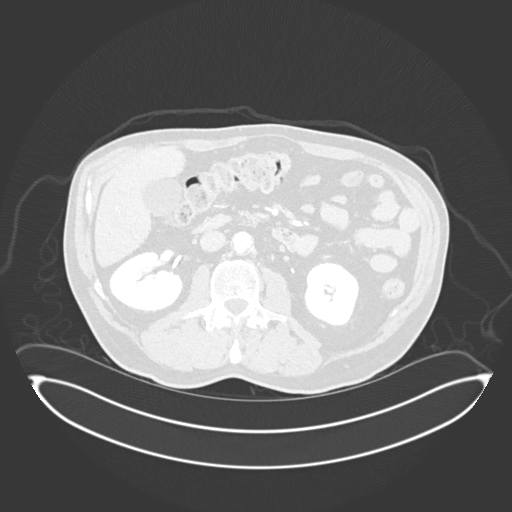
[im 443/493  lung]
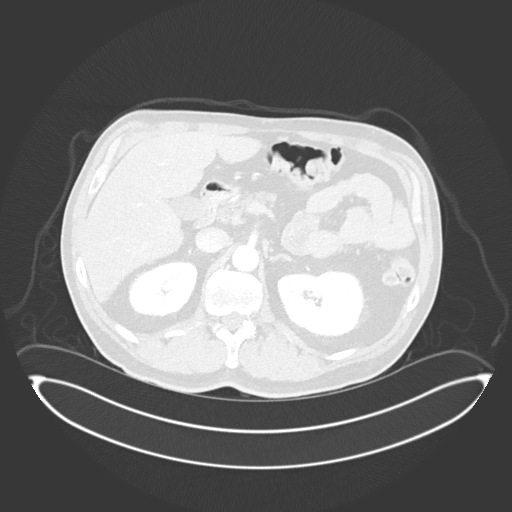
[im 460/493  soft-tissue]
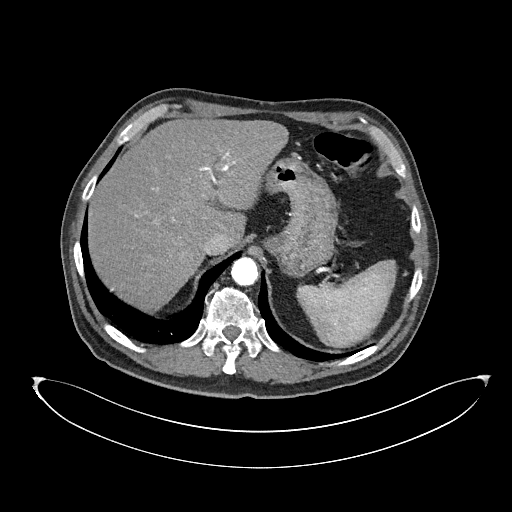
[im 460/493  lung]
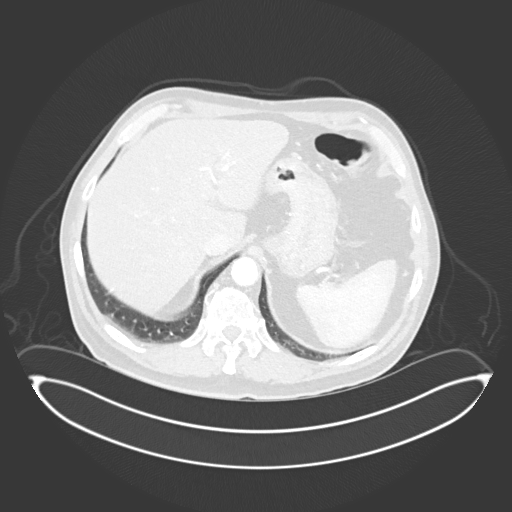
[im 476/493  lung]
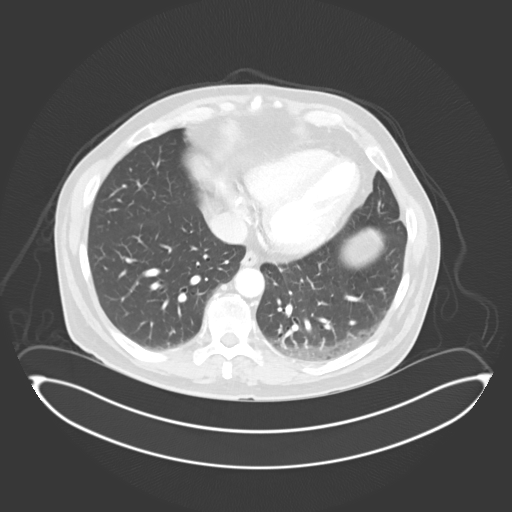

[Series 6: coronal upper · coronal · 1.18mm/px · 1 of 162 slices shown, 2 images]
[im 81/162  soft-tissue]
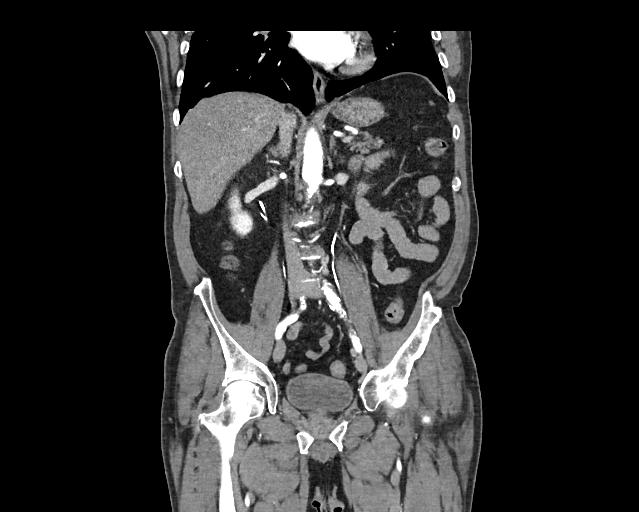
[im 81/162  bone]
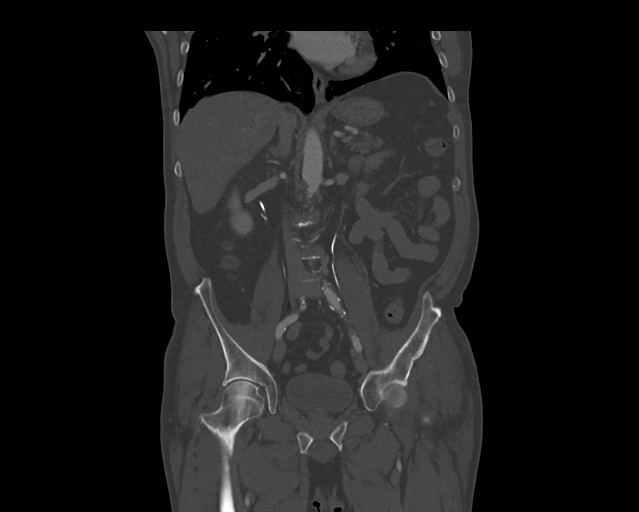

[12 of 46 positions shown; findings below may reference images not displayed]

FINDINGS: Aorta: The abdominal aorta is well visualized and demonstrates some
irregular soft atherosclerotic plaque throughout the infrarenal
segment. Very mild ectasia is noted with maximum diameter of 3.0 x
2.9 cm. Given the irregular nature of the atherosclerotic plaque
this is likely the etiology of the patient's embolic disease. The
celiac axis, superior mesenteric artery and inferior mesenteric
artery are all widely patent. Single renal arteries are identified
bilaterally without focal stenosis.

Right Lower Extremity: The right common iliac artery demonstrates
some calcified and noncalcified plaque. No focal stenosis is noted.
The external iliac artery shows similar findings. There is stenosis
of approximately 50% at the origin of the right superficial femoral
artery. Beyond this focal stenosis superficial femoral artery is
within normal limits. At the level of the adductor canal however
there is a focal concentric high-grade stenosis identified with
subsequent short segment occlusion. Reconstitution of the proximal
popliteal artery is noted via muscular collaterals. The popliteal
artery is patent but demonstrates some narrowing distally. The
popliteal trifurcation is patent. The anterior tibial artery is
occluded just beyond its origin. The peroneal and posterior tibial
arteries are patent to the level of the ankle.

Left Lower Extremity: The left common external iliac arteries show
similar findings to that on the right. A focal 40-50% stenosis of
the proximal external iliac artery is noted on the left. Common
femoral bifurcation is patent. The superficial femoral artery
demonstrates scattered atherosclerotic change throughout its course
with focal short segment occlusion at the level of the adductor
canal. Multiple muscular collaterals reconstitute the popliteal
artery after approximately 2-3 cm. It demonstrates scattered
atherosclerotic change as well. The trifurcation of the popliteal
artery on the left is widely patent with 2 vessel runoff to the
level of the ankle. The peroneal artery occludes in the mid to
distal calf. Anterior tibial artery is difficult to follow in the
foot. Posterior tibial artery extends to at least the level of the
metatarsal heads. More distal arterial visualization is difficult.

Review of the MIP images confirms the above findings.

Nonvascular: The liver is mildly fatty infiltrated. The gallbladder,
spleen, adrenal glands and pancreas are all normal in their CT
appearance. The kidneys demonstrate a normal enhancement pattern. A
few small cysts are noted. No obstructive changes are seen. Small
periumbilical fat containing hernia is noted. No bowel is noted
within.

Scattered diverticular change is seen without diverticulitis. The
appendix and bladder are within normal limits as visualized. No
significant lymphadenopathy is noted. No acute bony abnormality is
seen.
IMPRESSION: Irregular soft atherosclerotic plaque within the abdominal aorta
likely the source of the distal emboli.

Bilateral superficial femoral/popliteal artery occlusion at the
level of the adductor canal. Additionally there is a 50% stenosis of
the proximal right superficial femoral artery at its origin.

Two vessel runoff bilaterally involving the peroneal and posterior
tibial artery on the right and the anterior and posterior tibial
arteries on the left.

Chronic nonvascular changes as described above.

## 2015-12-02 IMAGING — CT CT ANGIO CHEST
3 of 10 series · 18 of 46 positions shown · IV contrast (OMNI)
Comparison: None.

CLINICAL DATA: History of left foot ischemia, check for embolic
source.

EXAM:
CT ANGIOGRAPHY CHEST WITH CONTRAST
TECHNIQUE: Multidetector CT imaging of the chest was performed using the
standard protocol during bolus administration of intravenous
contrast. Multiplanar CT image reconstructions and MIPs were
obtained to evaluate the vascular anatomy.
CONTRAST:  50mL OMNIPAQUE IOHEXOL 350 MG/ML SOLN, 100mL OMNIPAQUE
IOHEXOL 350 MG/ML SOLN

[Series 6: dissection 3.0 i30f 3 · axial · 0.79mm/px · z∈[+1442,+1712]mm · 10 of 111 slices shown]
[im 11/111  lung]
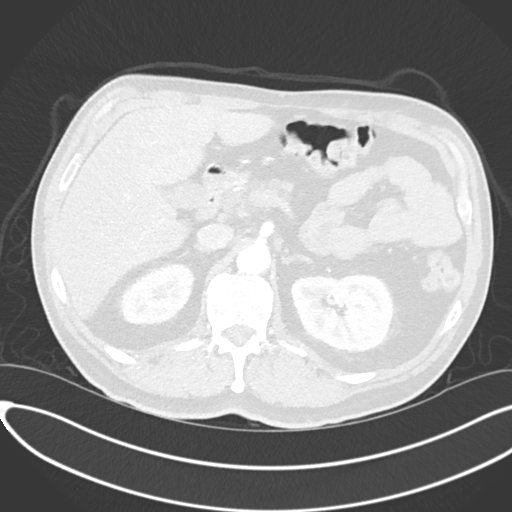
[im 21/111  soft-tissue]
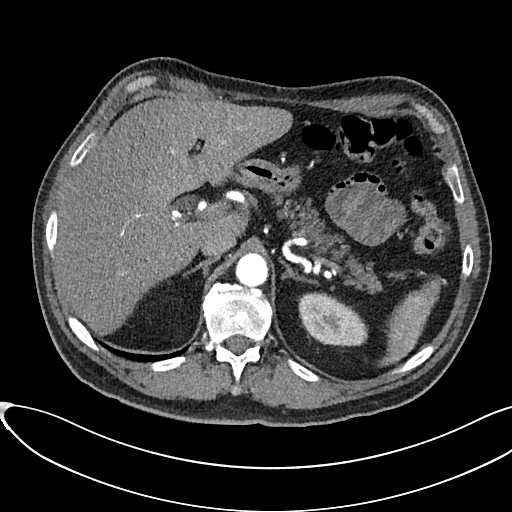
[im 31/111  lung]
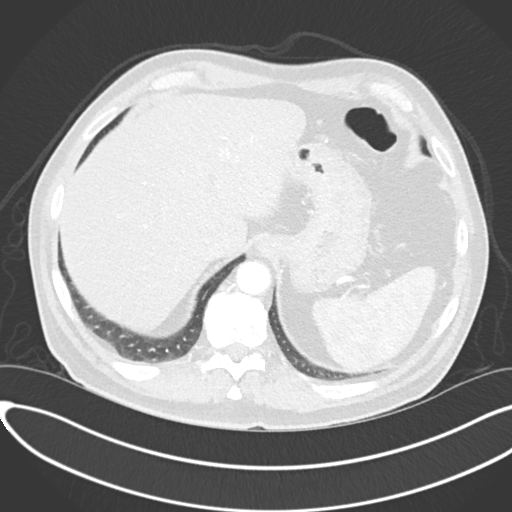
[im 41/111  soft-tissue]
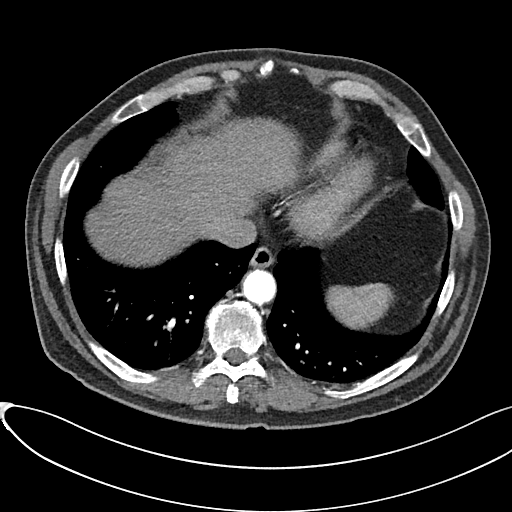
[im 51/111  lung]
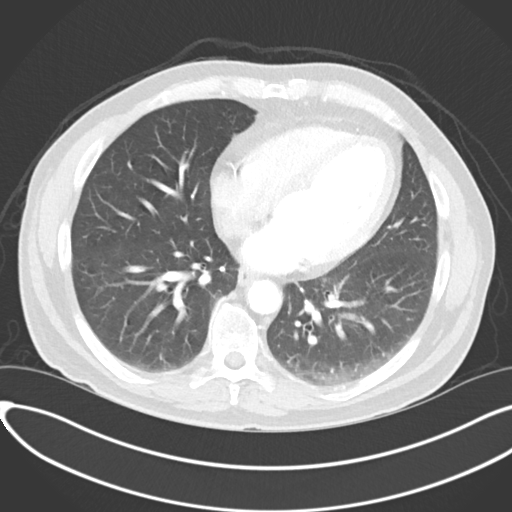
[im 61/111  soft-tissue]
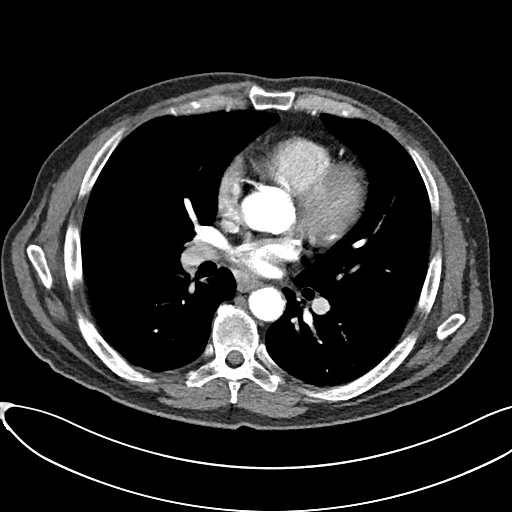
[im 71/111  lung]
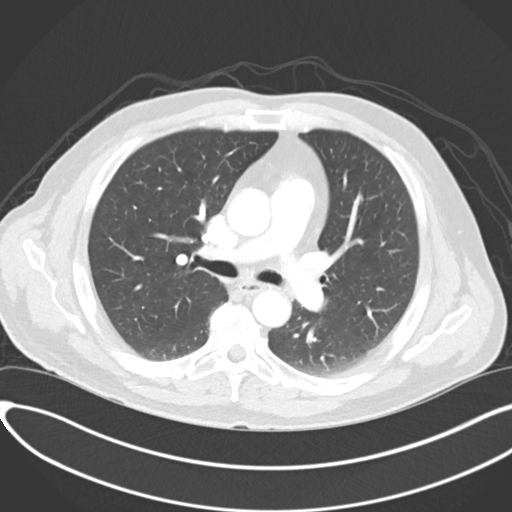
[im 81/111  soft-tissue]
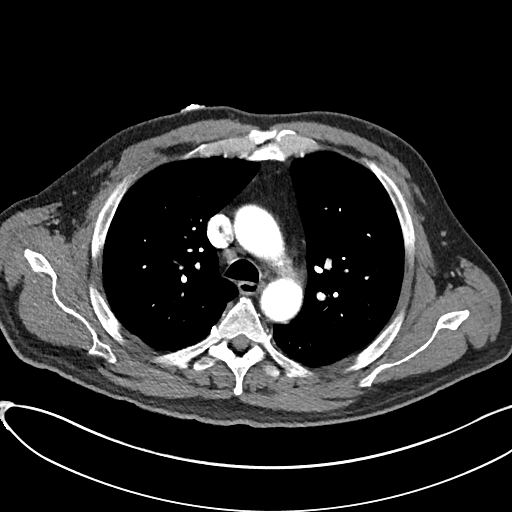
[im 91/111  lung]
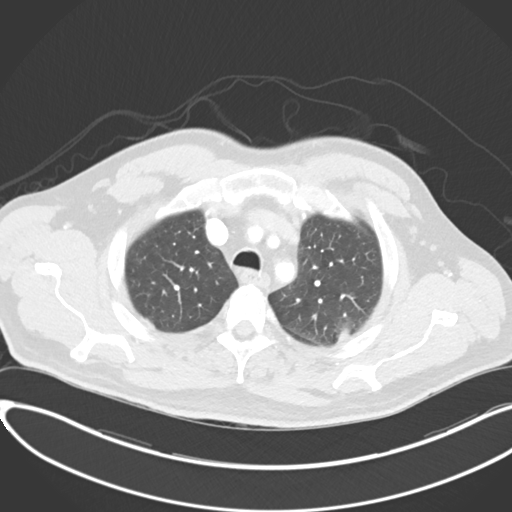
[im 101/111  soft-tissue]
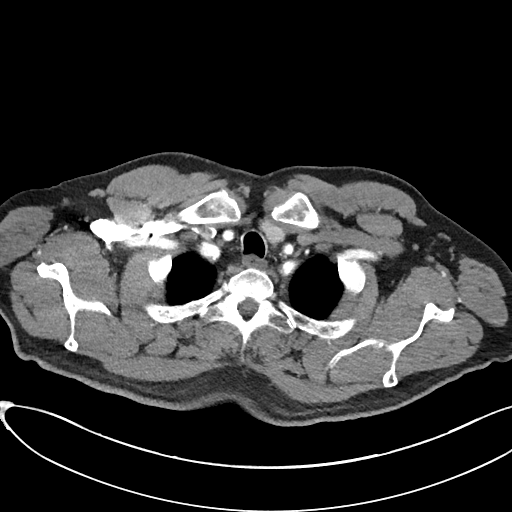

[Series 7: lung · axial · 0.79mm/px · z∈[+1484,+1628]mm · 5 of 96 slices shown]
[im 10/96  soft-tissue]
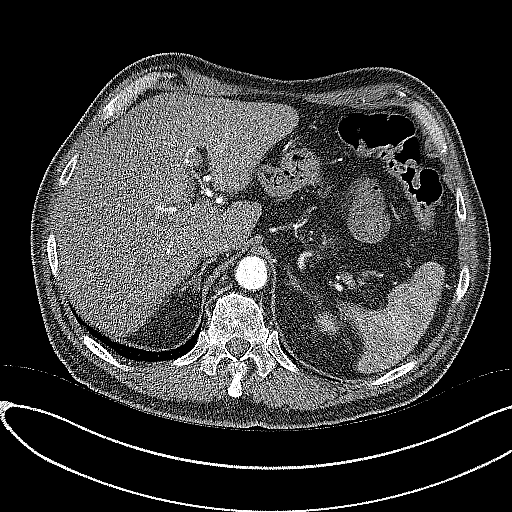
[im 20/96  soft-tissue]
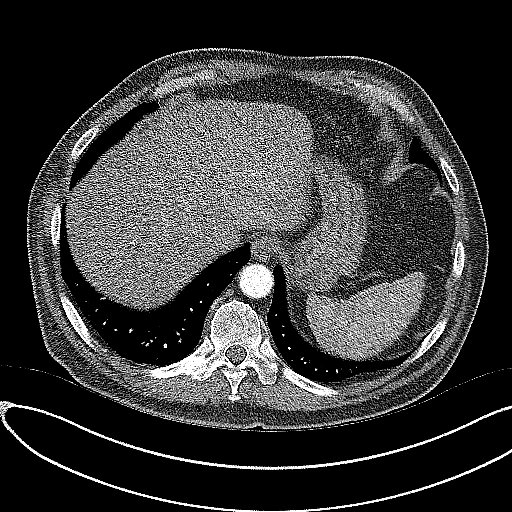
[im 29/96  soft-tissue]
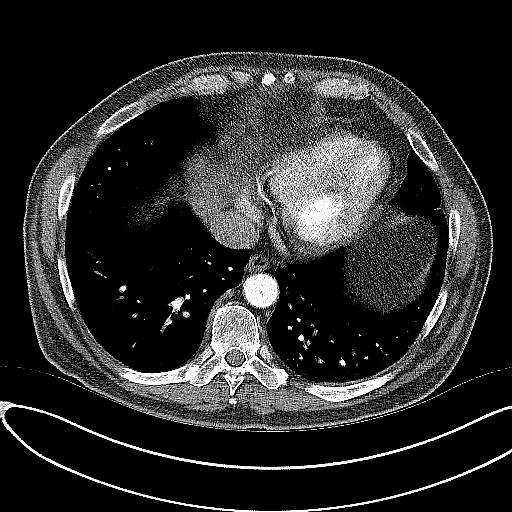
[im 39/96  soft-tissue]
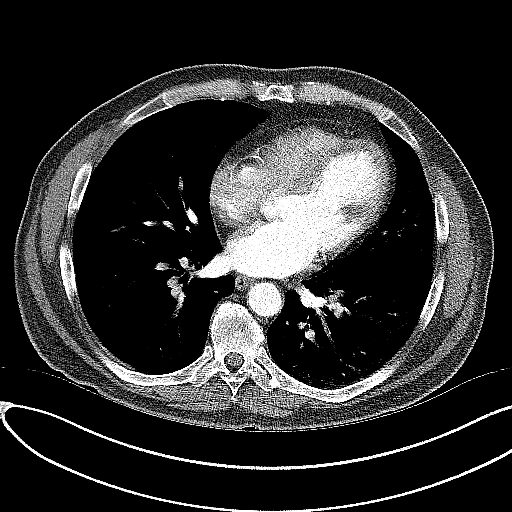
[im 58/96  soft-tissue]
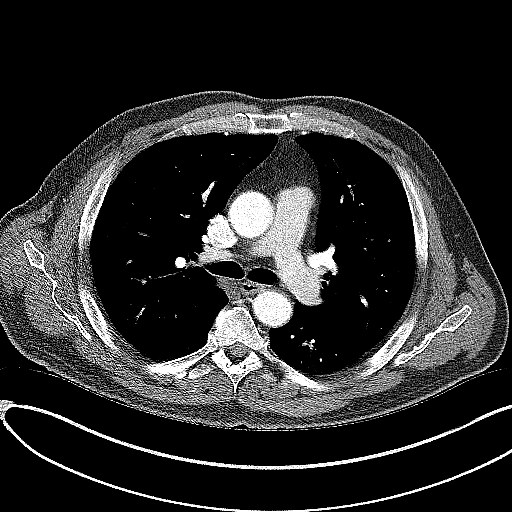

[Series 9: coronals · coronal · 0.65mm/px · 3 of 138 slices shown]
[im 35/138  soft-tissue]
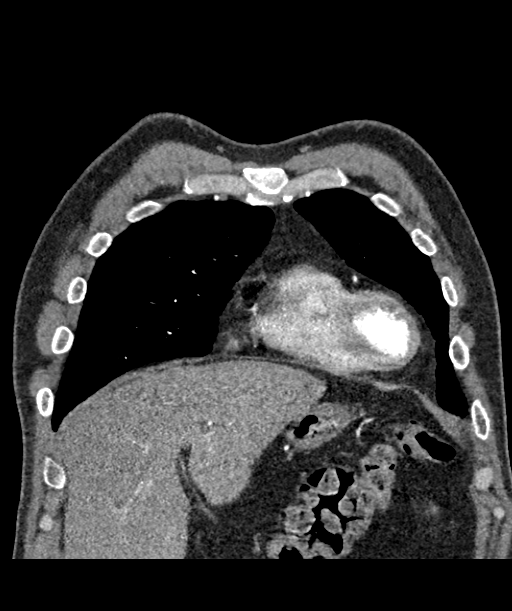
[im 69/138  soft-tissue]
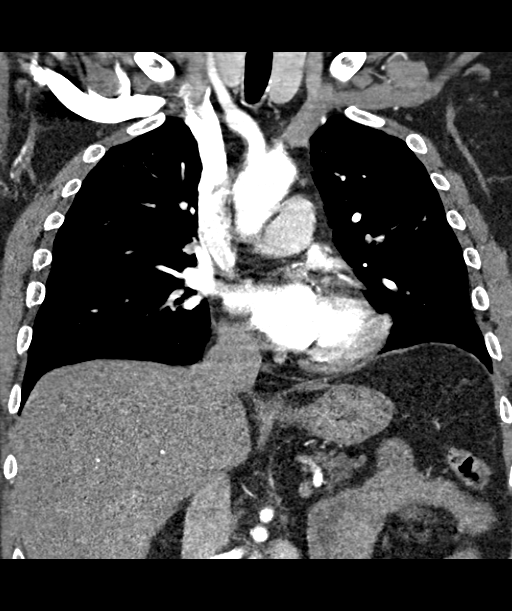
[im 103/138  soft-tissue]
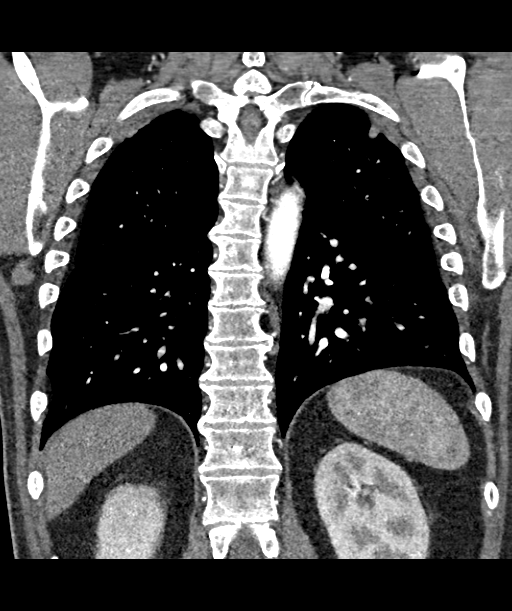

[18 of 46 positions shown; findings below may reference images not displayed]

FINDINGS: The lungs are well aerated bilaterally without focal infiltrate or
sizable effusion. No parenchymal nodule is noted.

The thoracic inlet shows some mild heterogeneity to the thyroid
without definitive nodule. The thoracic aorta show some mild
calcific changes. Very mild soft atherosclerotic plaque is noted in
the descending thoracic aorta. No dissection or aneurysmal
dilatation is seen. The origins of the brachiocephalic vessels are
within normal limits.

The cardiac structures show no acute abnormality. No large thrombus
is noted within the left atrium. No hilar or mediastinal adenopathy
is seen. No gross pulmonary embolism is seen. The osseous structures
as visualized are within normal limits.

Review of the MIP images confirms the above findings.
IMPRESSION: Mild atherosclerotic changes within the thoracic aorta without
aneurysmal dilatation or dissection. No definitive source for
embolic material is identified.

No other focal abnormality is noted.
